# Patient Record
Sex: Female | Born: 1935 | Race: White | Hispanic: No | Marital: Married | Smoking: Never smoker
Health system: Southern US, Community
[De-identification: ages and names within clinical notes are randomized; demographics above are authoritative.]

## PROBLEM LIST (undated history)

## (undated) DIAGNOSIS — E079 Disorder of thyroid, unspecified: Secondary | ICD-10-CM

## (undated) DIAGNOSIS — F0390 Unspecified dementia without behavioral disturbance: Secondary | ICD-10-CM

## (undated) HISTORY — PX: TONSILLECTOMY: SUR1361

## (undated) HISTORY — PX: APPENDECTOMY: SHX54

## (undated) HISTORY — PX: BACK SURGERY: SHX140

---

## 2002-05-28 ENCOUNTER — Encounter: Admission: RE | Admit: 2002-05-28 | Discharge: 2002-05-28 | Payer: Self-pay | Admitting: *Deleted

## 2002-05-28 ENCOUNTER — Encounter: Payer: Self-pay | Admitting: *Deleted

## 2003-01-21 ENCOUNTER — Other Ambulatory Visit: Admission: RE | Admit: 2003-01-21 | Discharge: 2003-01-21 | Payer: Self-pay | Admitting: Obstetrics & Gynecology

## 2004-08-17 ENCOUNTER — Encounter: Admission: RE | Admit: 2004-08-17 | Discharge: 2004-08-17 | Payer: Self-pay | Admitting: Gastroenterology

## 2004-09-13 ENCOUNTER — Ambulatory Visit (HOSPITAL_COMMUNITY): Admission: RE | Admit: 2004-09-13 | Discharge: 2004-09-13 | Payer: Self-pay | Admitting: Gastroenterology

## 2005-02-15 ENCOUNTER — Encounter (INDEPENDENT_AMBULATORY_CARE_PROVIDER_SITE_OTHER): Payer: Self-pay | Admitting: *Deleted

## 2005-02-15 ENCOUNTER — Ambulatory Visit (HOSPITAL_COMMUNITY): Admission: RE | Admit: 2005-02-15 | Discharge: 2005-02-16 | Payer: Self-pay | Admitting: General Surgery

## 2005-03-12 ENCOUNTER — Ambulatory Visit (HOSPITAL_COMMUNITY): Admission: RE | Admit: 2005-03-12 | Discharge: 2005-03-12 | Payer: Self-pay | Admitting: General Surgery

## 2005-06-24 ENCOUNTER — Emergency Department (HOSPITAL_COMMUNITY): Admission: EM | Admit: 2005-06-24 | Discharge: 2005-06-24 | Payer: Self-pay | Admitting: Emergency Medicine

## 2005-07-25 ENCOUNTER — Encounter: Admission: RE | Admit: 2005-07-25 | Discharge: 2005-09-06 | Payer: Self-pay | Admitting: Family Medicine

## 2005-12-31 ENCOUNTER — Encounter: Payer: Self-pay | Admitting: Interventional Radiology

## 2006-01-04 ENCOUNTER — Ambulatory Visit (HOSPITAL_COMMUNITY): Admission: RE | Admit: 2006-01-04 | Discharge: 2006-01-04 | Payer: Self-pay | Admitting: Interventional Radiology

## 2006-01-04 ENCOUNTER — Encounter (INDEPENDENT_AMBULATORY_CARE_PROVIDER_SITE_OTHER): Payer: Self-pay | Admitting: *Deleted

## 2006-01-23 ENCOUNTER — Encounter: Payer: Self-pay | Admitting: Interventional Radiology

## 2006-08-28 ENCOUNTER — Encounter: Payer: Self-pay | Admitting: Interventional Radiology

## 2010-01-31 ENCOUNTER — Encounter: Payer: Self-pay | Admitting: Interventional Radiology

## 2011-01-20 ENCOUNTER — Emergency Department (HOSPITAL_COMMUNITY): Payer: Medicare Other

## 2011-01-20 ENCOUNTER — Emergency Department (HOSPITAL_COMMUNITY)
Admission: EM | Admit: 2011-01-20 | Discharge: 2011-01-20 | Disposition: A | Discharge status: Home / Self Care | Payer: Medicare Other | Attending: Emergency Medicine | Admitting: Emergency Medicine

## 2011-01-20 DIAGNOSIS — Y929 Unspecified place or not applicable: Secondary | ICD-10-CM | POA: Insufficient documentation

## 2011-01-20 DIAGNOSIS — E039 Hypothyroidism, unspecified: Secondary | ICD-10-CM | POA: Insufficient documentation

## 2011-01-20 DIAGNOSIS — W010XXA Fall on same level from slipping, tripping and stumbling without subsequent striking against object, initial encounter: Secondary | ICD-10-CM | POA: Insufficient documentation

## 2011-01-20 DIAGNOSIS — M542 Cervicalgia: Secondary | ICD-10-CM | POA: Insufficient documentation

## 2011-01-20 DIAGNOSIS — S52599A Other fractures of lower end of unspecified radius, initial encounter for closed fracture: Secondary | ICD-10-CM | POA: Insufficient documentation

## 2011-01-20 DIAGNOSIS — R51 Headache: Secondary | ICD-10-CM | POA: Insufficient documentation

## 2011-01-20 DIAGNOSIS — S0180XA Unspecified open wound of other part of head, initial encounter: Secondary | ICD-10-CM | POA: Insufficient documentation

## 2011-01-23 ENCOUNTER — Observation Stay (HOSPITAL_COMMUNITY)
Admission: RE | Admit: 2011-01-23 | Discharge: 2011-01-24 | Disposition: A | Discharge status: Home / Self Care | Payer: Medicare Other | Source: Ambulatory Visit | Attending: Orthopaedic Surgery | Admitting: Orthopaedic Surgery

## 2011-01-23 DIAGNOSIS — S52599A Other fractures of lower end of unspecified radius, initial encounter for closed fracture: Principal | ICD-10-CM | POA: Insufficient documentation

## 2011-01-23 DIAGNOSIS — E039 Hypothyroidism, unspecified: Secondary | ICD-10-CM | POA: Insufficient documentation

## 2011-01-23 DIAGNOSIS — Z01812 Encounter for preprocedural laboratory examination: Secondary | ICD-10-CM | POA: Insufficient documentation

## 2011-01-23 DIAGNOSIS — M129 Arthropathy, unspecified: Secondary | ICD-10-CM | POA: Insufficient documentation

## 2011-01-23 DIAGNOSIS — W19XXXA Unspecified fall, initial encounter: Secondary | ICD-10-CM | POA: Insufficient documentation

## 2011-01-23 LAB — SURGICAL PCR SCREEN
MRSA, PCR: NEGATIVE
Staphylococcus aureus: NEGATIVE

## 2011-01-23 LAB — CBC
Hemoglobin: 12.5 g/dL (ref 12.0–15.0)
Platelets: 157 10*3/uL (ref 150–400)
RBC: 4.1 MIL/uL (ref 3.87–5.11)
WBC: 5 10*3/uL (ref 4.0–10.5)

## 2011-01-23 LAB — PROTIME-INR
INR: 1.05 (ref 0.00–1.49)
Prothrombin Time: 13.9 seconds (ref 11.6–15.2)

## 2011-01-27 NOTE — Op Note (Signed)
[name], FRESE              ACCOUNT NO.:  [id number]  MEDICAL RECORD NO.:  [id number]           PATIENT TYPE:  I  LOCATION:  5031                         FACILITY:  [facility]  PHYSICIAN:  Vanita Panda. Magnus Ivan, M.D.DATE OF BIRTH:  Jan 13, 1923  DATE OF PROCEDURE:  01/23/2011 DATE OF DISCHARGE:                              OPERATIVE REPORT   PREOPERATIVE DIAGNOSIS:  Left unstable displaced extraarticular distal radius fracture.  POSTOPERATIVE DIAGNOSIS:  Left unstable displaced extraarticular distal radius fracture.  PROCEDURE:  Open reduction and internal fixation of left distal radius fracture using a hand innovations distal volar locking plate.  SURGEON:  Vanita Panda. Magnus Ivan, MD  ANESTHESIA: 1. Left upper extremity regional anesthesia with Bier block. 2. Local infiltration with 0.25% plain Sensorcaine.  ANTIBIOTICS:  600 mg IV clindamycin.  TECHNIQUE:  Less than 2 hours.  BLOOD LOSS:  Minimal.  COMPLICATIONS:  None.  INDICATIONS:  [name] is an very pleasant elderly 77 year old who is right-hand dominant.  She sustained mechanical fall this week and had a severely displaced extraarticular distal radius fracture due to the frail nature of her skin and displaced nature of this fracture, and due to the fact that she is active individual, I recommend she undergo open reduction and internal fixation with plating of this fracture.  The risks and benefits of this were explained to her in detail, and she did wish to proceed with surgery.  PROCEDURE DESCRIPTION:  After informed consent was obtained, the appropriate left wrist was marked.  She was brought to the operating room and placed supine on the operating table with her left arm on the radiolucent arm table.  A Bier block on the upper extremity was then obtained.  Her left hand and wrist were prepped and draped with DuraPrep and sterile drapes.  A time-out called  to identify the correct patient and correct  left wrist.  I then tested with wrist forceps and she had adequate anesthesia.  I was able to make incision with volar aspect of wrist and dissect down interval between the radial artery and flexor carpi radialis tendon.  I was able to expose pronator quadratus and divide this to identify the fracture.  This was a shear type of fracture, there was volarly based.  I was able to pull the fracture into reduced the position and verified this under fluoroscopic guidance.  I then choose a narrow hand innovations with distal volar locking plate and placed this along the volar cortex.  I secured this proximally and distally with bicortical screws proximally and locking plate distally to secure the fracture.  I then put the wrist with good range of motion and was stable.  I copiously irrigated the tissues, and then I closed the subcutaneous tissue with interrupted 2-0 Vicryl  followed by interrupted 3-0 nylon   on the skin.  The incision was infiltrated with 0.25% plain Sensorcaine.  Xeroform followed by well-padded volar plaster splint were applied.  The tourniquet was let down, and the fingers pinkened and nicely.  The patient was awakened and was taken to recovery room in stable condition.  Postoperatively, we will admit her for  pain control and IV antibiotics with discharge tomorrow and hopefully remove wrist splint.     Vanita Panda. Magnus Ivan, M.D.     CYB/MEDQ  D:  01/23/2011  T:  01/24/2011  Job:  [id number]  Electronically Signed by Doneen Poisson M.D. on 01/27/2011 07:17:37 PM

## 2011-01-27 NOTE — Discharge Summary (Signed)
  [name], [name]              ACCOUNT NO.:  [id number]  MEDICAL RECORD NO.:  [id number]           PATIENT TYPE:  I  LOCATION:  5031                         FACILITY:  [facility]  PHYSICIAN:  Vanita Panda. Magnus Ivan, M.D.DATE OF BIRTH:  28-Oct-1923  DATE OF ADMISSION:  01/23/2011 DATE OF DISCHARGE:  01/24/2011                              DISCHARGE SUMMARY   ADMITTING DIAGNOSIS:  Left distal radius fracture.  DISCHARGE DIAGNOSIS:  Left distal radius fracture.  PROCEDURE:  Open reduction and internal fixation of left distal radius fracture performed on January 23, 2011.  HOSPITAL COURSE:  [name] is a pleasant 77 year old female who had sustained a mechanical fall on Sunday of this week.  She was seen in our office on Monday and was found to have a severely placed distal radius fracture that was extraarticular.  It was recommended she undergo open reduction and internal fixation of this injury.  She was taken to the operating room on the day of admission where she underwent successful plating of her wrist.  She was then admitted for observation.  By postop day 1, she was doing well.  Her incision was clean and dry and intact, and it was felt that she could be discharged today to home.  DISPOSITION:  To home.  DISCHARGE INSTRUCTIONS:  While she is at home, she will wait 5 days to get her incision wet in shower.  She will use a Velcro wrist splint which she can come out after working on range of motion of her wrist. Followup appointment will be established with Dr. Magnus Ivan in 2 weeks.  DISCHARGE MEDICATIONS:  Percocet as needed for pain.     Vanita Panda. Magnus Ivan, M.D.     CYB/MEDQ  D:  01/24/2011  T:  01/24/2011  Job:  [id number]  Electronically Signed by Doneen Poisson M.D. on 01/27/2011 07:17:38 PM

## 2011-01-27 NOTE — H&P (Signed)
  [name], [name]              ACCOUNT NO.:  [id number]  MEDICAL RECORD NO.:  [id number]           PATIENT TYPE:  I  LOCATION:  5031                         FACILITY:  [facility]  PHYSICIAN:  Vanita Panda. Magnus Ivan, M.D.DATE OF BIRTH:  Apr 05, 1923  DATE OF ADMISSION:  01/23/2011 DATE OF DISCHARGE:                             HISTORY & PHYSICAL   CHIEF COMPLAINT:  Left wrist pain with known unstable distal radius fracture.  HISTORY OF PRESENT ILLNESS:  [name] is an very active 77 year old female who had mechanical fall this week and she was seen in the emergency room at [facility] and found to have unstable left distal radius fracture.  She was placed appropriately in a splint and given follow up in our office yesterday.  I showed the x-rays to her husband and they felt this was a significantly shortened and displaced distal radius fracture.  She is an very active 77 year old and due to the displaced nature of the fracture, I recommend she undergo plating that will allow for [place] to avoid any type of casting on her fragile skin.  She did understand the risk and benefits of this and does wish to proceed with surgery, especially given the fact that she is going to be going to [place] in a month and we can have her out of a cast even in just a week or 2.  The risks and benefits of surgery were explained to her in detail and her husband did wish to proceed with surgery.  PAST MEDICAL HISTORY: 1. Osteoporosis. 2. Thyroid disease. 3. Acid reflux. 4. Cataracts.  MEDICATIONS: 1. Levothyroxine. 2. Aspirin 81 mg. 3. Multivitamin. 4. Vitamin D. 5. Calcium.  ALLERGIES:  PENICILLIN allergy.  FAMILY MEDICAL HISTORY:  Bladder and colon cancer.  SOCIAL HISTORY:  She is married.  Her husband is with her.  She does not smoke, does not drink.  REVIEW OF SYSTEMS:  Negative for chest pain, shortness of breath, fever, chills, nausea, and vomiting.  PHYSICAL EXAMINATION:  VITAL SIGNS:   She is afebrile with stable vital signs. GENERAL:  She is alert and oriented x3, in no acute distress, in minimal discomfort. HEENT:  Normocephalic and atraumatic.  Pupils are equal, round, and reactive to light.  There is bruising on her face from her fall and around her eye, but her visions intact. NECK:  Supple.  No JVD.  No bruits. LUNGS:  Clear to auscultation bilaterally. HEART:  Regular rate and rhythm. ABDOMEN:  Benign. EXTREMITIES:  Left wrist shows severe bruising and deformity of left wrist.  She is neurovascularly intact.  X-rays reviewed and with completely displaced and shortened left distal radius fracture.  It does appear to be extraarticular.  ASSESSMENT:  Unstable left distal radius fracture.  PLAN:  We will proceed to the operating room today for open reduction and internal fixation volar plate, we then admit her for observation.     Vanita Panda. Magnus Ivan, M.D.     CYB/MEDQ  D:  01/23/2011  T:  01/24/2011  Job:  [id number]  Electronically Signed by Doneen Poisson M.D. on 01/27/2011 07:17:31 PM

## 2011-02-07 ENCOUNTER — Other Ambulatory Visit: Payer: Self-pay | Admitting: Family Medicine

## 2011-02-07 DIAGNOSIS — M549 Dorsalgia, unspecified: Secondary | ICD-10-CM

## 2011-02-07 DIAGNOSIS — M546 Pain in thoracic spine: Secondary | ICD-10-CM

## 2011-02-09 ENCOUNTER — Ambulatory Visit
Admission: RE | Admit: 2011-02-09 | Discharge: 2011-02-09 | Disposition: A | Discharge status: Home / Self Care | Payer: Medicare Other | Source: Ambulatory Visit | Attending: Family Medicine | Admitting: Family Medicine

## 2011-02-09 DIAGNOSIS — M549 Dorsalgia, unspecified: Secondary | ICD-10-CM

## 2011-02-09 DIAGNOSIS — M546 Pain in thoracic spine: Secondary | ICD-10-CM

## 2011-02-12 ENCOUNTER — Other Ambulatory Visit (HOSPITAL_COMMUNITY): Payer: Self-pay | Admitting: Interventional Radiology

## 2011-02-12 DIAGNOSIS — IMO0002 Reserved for concepts with insufficient information to code with codable children: Secondary | ICD-10-CM

## 2011-02-14 ENCOUNTER — Ambulatory Visit (HOSPITAL_COMMUNITY)
Admission: RE | Admit: 2011-02-14 | Discharge: 2011-02-14 | Disposition: A | Discharge status: Home / Self Care | Payer: Medicare Other | Source: Ambulatory Visit | Attending: Interventional Radiology | Admitting: Interventional Radiology

## 2011-02-14 DIAGNOSIS — K219 Gastro-esophageal reflux disease without esophagitis: Secondary | ICD-10-CM | POA: Insufficient documentation

## 2011-02-14 DIAGNOSIS — IMO0002 Reserved for concepts with insufficient information to code with codable children: Secondary | ICD-10-CM

## 2011-02-14 DIAGNOSIS — E039 Hypothyroidism, unspecified: Secondary | ICD-10-CM | POA: Insufficient documentation

## 2011-02-14 DIAGNOSIS — Z01812 Encounter for preprocedural laboratory examination: Secondary | ICD-10-CM | POA: Insufficient documentation

## 2011-02-14 DIAGNOSIS — M810 Age-related osteoporosis without current pathological fracture: Secondary | ICD-10-CM | POA: Insufficient documentation

## 2011-02-14 DIAGNOSIS — M8448XA Pathological fracture, other site, initial encounter for fracture: Secondary | ICD-10-CM | POA: Insufficient documentation

## 2011-02-14 DIAGNOSIS — Z79899 Other long term (current) drug therapy: Secondary | ICD-10-CM | POA: Insufficient documentation

## 2011-02-14 LAB — PROTIME-INR: Prothrombin Time: 14.4 seconds (ref 11.6–15.2)

## 2011-02-14 LAB — CBC
MCV: 90.5 fL (ref 78.0–100.0)
Platelets: 276 10*3/uL (ref 150–400)
RDW: 13.6 % (ref 11.5–15.5)
WBC: 7.1 10*3/uL (ref 4.0–10.5)

## 2011-02-14 LAB — POCT I-STAT, CHEM 8
Creatinine, Ser: 1.2 mg/dL (ref 0.4–1.2)
Hemoglobin: 14.6 g/dL (ref 12.0–15.0)
Sodium: 137 mEq/L (ref 135–145)
TCO2: 25 mmol/L (ref 0–100)

## 2011-02-14 LAB — DIFFERENTIAL
Basophils Absolute: 0 10*3/uL (ref 0.0–0.1)
Basophils Relative: 0 % (ref 0–1)
Eosinophils Absolute: 0.1 10*3/uL (ref 0.0–0.7)
Eosinophils Relative: 1 % (ref 0–5)
Lymphs Abs: 0.6 10*3/uL — ABNORMAL LOW (ref 0.7–4.0)

## 2011-02-14 MED ORDER — IOHEXOL 300 MG/ML  SOLN: 50.0000 mL | Freq: Once | INTRAMUSCULAR | Status: DC | PRN

## 2011-02-14 MED ORDER — IOHEXOL 300 MG/ML  SOLN
50.0000 mL | Freq: Once | INTRAMUSCULAR | Status: AC | PRN
  Administered 2011-02-14: 3 mL via INTRAVENOUS

## 2011-02-19 ENCOUNTER — Other Ambulatory Visit (HOSPITAL_COMMUNITY): Payer: Self-pay | Admitting: Interventional Radiology

## 2011-02-19 DIAGNOSIS — M549 Dorsalgia, unspecified: Secondary | ICD-10-CM

## 2011-02-21 ENCOUNTER — Ambulatory Visit (HOSPITAL_COMMUNITY)
Admission: RE | Admit: 2011-02-21 | Discharge: 2011-02-21 | Disposition: A | Discharge status: Home / Self Care | Payer: Medicare Other | Source: Ambulatory Visit | Attending: Interventional Radiology | Admitting: Interventional Radiology

## 2011-02-21 ENCOUNTER — Other Ambulatory Visit (HOSPITAL_COMMUNITY): Payer: Self-pay | Admitting: Interventional Radiology

## 2011-02-21 DIAGNOSIS — S22009A Unspecified fracture of unspecified thoracic vertebra, initial encounter for closed fracture: Secondary | ICD-10-CM | POA: Insufficient documentation

## 2011-02-21 DIAGNOSIS — X58XXXA Exposure to other specified factors, initial encounter: Secondary | ICD-10-CM | POA: Insufficient documentation

## 2011-02-21 DIAGNOSIS — M47817 Spondylosis without myelopathy or radiculopathy, lumbosacral region: Secondary | ICD-10-CM | POA: Insufficient documentation

## 2011-02-21 DIAGNOSIS — M549 Dorsalgia, unspecified: Secondary | ICD-10-CM

## 2011-02-21 DIAGNOSIS — IMO0002 Reserved for concepts with insufficient information to code with codable children: Secondary | ICD-10-CM

## 2011-02-23 ENCOUNTER — Other Ambulatory Visit: Payer: Self-pay | Admitting: Interventional Radiology

## 2011-02-23 ENCOUNTER — Ambulatory Visit (HOSPITAL_COMMUNITY)
Admission: RE | Admit: 2011-02-23 | Discharge: 2011-02-23 | Disposition: A | Discharge status: Home / Self Care | Payer: Medicare Other | Source: Ambulatory Visit | Attending: Interventional Radiology | Admitting: Interventional Radiology

## 2011-02-23 ENCOUNTER — Other Ambulatory Visit (HOSPITAL_COMMUNITY): Payer: Self-pay | Admitting: Interventional Radiology

## 2011-02-23 DIAGNOSIS — M8448XA Pathological fracture, other site, initial encounter for fracture: Secondary | ICD-10-CM | POA: Insufficient documentation

## 2011-02-23 DIAGNOSIS — K219 Gastro-esophageal reflux disease without esophagitis: Secondary | ICD-10-CM | POA: Insufficient documentation

## 2011-02-23 DIAGNOSIS — IMO0002 Reserved for concepts with insufficient information to code with codable children: Secondary | ICD-10-CM

## 2011-02-23 DIAGNOSIS — Z01812 Encounter for preprocedural laboratory examination: Secondary | ICD-10-CM | POA: Insufficient documentation

## 2011-02-23 DIAGNOSIS — M810 Age-related osteoporosis without current pathological fracture: Secondary | ICD-10-CM | POA: Insufficient documentation

## 2011-02-23 LAB — POCT I-STAT, CHEM 8
Creatinine, Ser: 0.9 mg/dL (ref 0.4–1.2)
Glucose, Bld: 114 mg/dL — ABNORMAL HIGH (ref 70–99)
Hemoglobin: 14.6 g/dL (ref 12.0–15.0)
Sodium: 142 mEq/L (ref 135–145)
TCO2: 28 mmol/L (ref 0–100)

## 2011-02-23 LAB — PROTIME-INR: Prothrombin Time: 13.4 seconds (ref 11.6–15.2)

## 2011-02-23 LAB — CBC
MCH: 29.4 pg (ref 26.0–34.0)
MCHC: 32.7 g/dL (ref 30.0–36.0)
Platelets: 235 10*3/uL (ref 150–400)
RBC: 4.69 MIL/uL (ref 3.87–5.11)

## 2011-02-23 MED ORDER — IOHEXOL 300 MG/ML  SOLN
50.0000 mL | Freq: Once | INTRAMUSCULAR | Status: AC | PRN
  Administered 2011-02-23: 10 mL

## 2011-02-27 ENCOUNTER — Other Ambulatory Visit (HOSPITAL_COMMUNITY): Payer: Self-pay | Admitting: Interventional Radiology

## 2011-02-27 DIAGNOSIS — IMO0002 Reserved for concepts with insufficient information to code with codable children: Secondary | ICD-10-CM

## 2011-03-09 ENCOUNTER — Ambulatory Visit (HOSPITAL_COMMUNITY)
Admission: RE | Admit: 2011-03-09 | Discharge: 2011-03-09 | Disposition: A | Discharge status: Home / Self Care | Payer: Medicare Other | Source: Ambulatory Visit | Attending: Interventional Radiology | Admitting: Interventional Radiology

## 2011-03-09 DIAGNOSIS — IMO0002 Reserved for concepts with insufficient information to code with codable children: Secondary | ICD-10-CM

## 2011-03-30 ENCOUNTER — Other Ambulatory Visit: Payer: Self-pay | Admitting: General Surgery

## 2011-03-30 ENCOUNTER — Ambulatory Visit
Admission: RE | Admit: 2011-03-30 | Discharge: 2011-03-30 | Disposition: A | Discharge status: Home / Self Care | Payer: Medicare Other | Source: Ambulatory Visit | Attending: General Surgery | Admitting: General Surgery

## 2011-03-30 DIAGNOSIS — R52 Pain, unspecified: Secondary | ICD-10-CM

## 2011-04-06 NOTE — Op Note (Signed)
[name], [name]              ACCOUNT NO.:  [id number]   MEDICAL RECORD NO.:  [id number]          PATIENT TYPE:  OIB   LOCATION:  2887                         FACILITY:  [facility]   PHYSICIAN:  Adolph Pollack, M.D.DATE OF BIRTH:  [birth year]   DATE OF PROCEDURE:  02/15/2005  DATE OF DISCHARGE:                                 OPERATIVE REPORT   PREOPERATIVE DIAGNOSIS:  Symptomatic cholelithiasis.   POSTOPERATIVE DIAGNOSIS:  Chronic calculus cholecystitis.   PROCEDURE:  Laparoscopic cholecystectomy with intraoperative cholangiogram.   SURGEON:  Adolph Pollack, M.D.   ASSISTANT:  Magnus Ivan, RNFA.   ANESTHESIA:  General.   INDICATIONS:  [name] is a 77 year old female who has had intermittent  episodes of pressure and cramping-type right upper quadrant pain, radiating  around to her back with little nausea.  Evaluation demonstrated multiple  gallstones on ultrasound.  The gallbladder was thickened.  The bile duct  appeared within normal limits.  She now presents for elective laparoscopic  cholecystectomy.  I have discussed the procedure and the risks with her  previously.   TECHNIQUE OF PROCEDURE:  She was seen in the holding area and then brought  to the operating room and placed supine on the operating table and a general  anesthetic was administered.  The abdominal wall was sterilely prepped and  draped.  Marcaine solution was infiltrated in the subumbilical region.  A  small subumbilical incision was made through the skin, subcutaneous tissue,  fascia, peritoneum and the peritoneal cavity was entered under direct  vision.  A purse-string suture of 0 Vicryl was placed around the fascial  edges.  A Hasson trocar was introduced into the peritoneal cavity and  pneumoperitoneum was created by insufflation of CO2 gas.   Next, the laparoscope was introduced.  An 11-mm trocar was placed in the  epigastric incision and two 5-mm trocars were placed through right  middle  abdominal wall incisions.  She was placed in the reversed Trendelenburg  position with the right side tilted slightly up.  The fundus of the  gallbladder was grasped and retracted towards the right shoulder.  The  gallbladder appeared to have some grossly chronic inflammatory-type changes.  The infundibulum was grasped and using blunt dissection and select cautery  the infundibulum was mobilized.  The cystic duct was isolated and a window  was created around it.  The cystic artery was isolated and a window created  around it as well.  The cystic duct was clipped just above the cystic  duct/gallbladder junction and a small incision made at the cystic  duct/gallbladder junction.  Some bile was evacuated.  A cholangiocatheter  was passed through the anterior abdominal wall and placed into the cystic  duct and cholangiogram performed.   Under real-time fluoroscopy, dilute contrast material was injected into the  cystic duct which was of moderate length.  The common hepatic, right and  left hepatic and common bile duct all filled promptly and contrast went into  the duodenum promptly.  No obvious evidence of obstruction existed.  Final  report is pending radiologist's interpretation.   The cholangiocatheter  was then removed, the cystic duct was clipped three  times proximally and divided sharply.  The cystic artery was clipped and  divided.  The gallbladder was dissected free from the liver bed intact using  electrocautery.  The gallbladder fossa was irrigated and inspected.  No  bleeding or bile leak was noted.  The gallbladder was placed in an Endopouch  bag and removed through the subumbilical port.   The subumbilical fascial defect was closed by tying down the purse-string  suture under laparoscopic vision.  An adhesion between the abdominal wall  and the omentum was then lysed sharply in this area.  The perihepatic area  was irrigated and the fluid evacuated.  The remaining  trocars were removed  and pneumoperitoneum released.   The skin incisions were closed with 4-0 Monocryl subcuticular stitches.  Steri-Strips and sterile dressings were applied.  She tolerated the  procedure well without any apparent complications and was taken to the  recovery room in satisfactory condition.      TJR/MEDQ  D:  02/15/2005  T:  02/15/2005  Job:  [id number]   cc:   Caryn Bee L. Little, M.D.  [address]  [place]  [place] [address]  Fax: [phone]   Anselmo Rod, M.D.  [address]  [address]  [place]  [place] [address]  Fax: [phone]

## 2011-04-06 NOTE — Consult Note (Signed)
[name], [name]              ACCOUNT NO.:  [id number]   MEDICAL RECORD NO.:  [id number]          PATIENT TYPE:  OUT   LOCATION:  XRAY                         FACILITY:  [facility]   PHYSICIAN:  Sanjeev K. Deveshwar, M.D.DATE OF BIRTH:  Jul 21, 1923   DATE OF CONSULTATION:  12/31/2005  DATE OF DISCHARGE:                                   CONSULTATION   ADDENDUM:  Greater than 40 minutes was spent on this consult today.      Delton See, P.A.    ______________________________  Grandville Silos. Corliss Skains, M.D.    DR/MEDQ  D:  12/31/2005  T:  12/31/2005  Job:  [id number]

## 2011-04-06 NOTE — Consult Note (Signed)
NAMECARRELL, PALMATIER              ACCOUNT NO.:  [id number]   MEDICAL RECORD NO.:  [id number]          PATIENT TYPE:  OUT   LOCATION:  XRAY                         FACILITY:  [facility]   PHYSICIAN:  Delton See, P.A.   DATE OF BIRTH:  [birth year]   DATE OF CONSULTATION:  08/28/2006  DATE OF DISCHARGE:                                   CONSULTATION   DATE OF CONSULTATION:  August 28, 2006.   REFERRING PHYSICIAN:  Dr. Catha Gosselin.   CHIEF COMPLAINT:  Back pain.   HISTORY OF PRESENT ILLNESS:  This is a pleasant 77 year old female who was  previously referred to Dr. Corliss Skains through the courtesy of Dr. Catha Gosselin for evaluation of back pain in March 2007.  The patient was found to  have two compression fractures, one at T6 and one at T7, by an MRI performed  at [facility].  On January 04, 2006, the patient underwent a kyphoplasty  procedure at the T6 level.  The T7 level was attempted, however, it was too  severely compressed to be treated.  The patient did well following these  procedures.  However, recently over the past several weeks she has had  recurrent back pain and she returns today for further evaluation.   PAST MEDICAL HISTORY:  Significant for gastroesophageal reflux disease,  hyperlipidemia, a history of palpitations, hypothyroidism, and osteoporosis.   SURGICAL HISTORY:  Significant for shoulder surgery, appendectomy, and  cholecystectomy.   ALLERGIES:  She is allergic to PENICILLIN.   CURRENT MEDICATIONS:  The patient is taking Advil on a p.r.n. basis for  pain.  She is not on any antihypertensive medications.  We do not have a  list of her medications at this time   SOCIAL HISTORY:  The patient is married.  She has two children.  She and her  husband live in [place].  They have a daughter in [place], [place].  She has never been a smoker.  She drinks alcohol occasionally.  She is a  retired [employer] operational officer.   FAMILY HISTORY:  Her mother died at age 34  from cancer.  Her father died at  age 12 when he was struck by a motor vehicle.   IMPRESSION AND PLAN:  As noted, the patient returns today to be evaluated  for further back pain.  She describes a pain which is ligated pinprick  sensation on the right just under her scapula.  It radiates to the left and  becomes a dull ache.  The pain was bad several weeks ago, however, it  appears to be improving.  It is worse with movement and it does become more  severe as the day progresses.   The patient also reports that this morning prior to her office visit she had  a dizzy spell when she got up out of bed.  A short while later she was  ambulating and she suddenly became acutely dizzy and fell straight back,  hitting her head on the carpet and striking her back on a small table I  believe.  She did not notice any heart racing prior to  this event.  She  denies loss of consciousness.  As noted, she is not on any blood pressure  medications.  She states the episode occurred very suddenly.   Dr. Corliss Skains discussed the situation with the patient and her husband.  He  recommended an MRI of the thoracic spine to rule out a new compression  fracture as possible source of her back pain.  He also recommended an MRA of  the neck to rule out significant vascular disease as a result of her fall  today.  Apparently she has had a similar fall in the past.  Other concerns  would be that this was a severe case of vertigo or that she had some type of  heart arrhythmia causing her to have a brief syncopal episode, although she  did deny loss of consciousness and she did not feel her heart racing prior  to this event.  These studies will be scheduled on an outpatient basis.  We  will contact the patient once we have the results.  She also plans to  followup with a primary care physician, Dr. Clarene Duke, to discuss this episode.   Greater than 30 minutes was spent on this consult.      Delton See, P.A.      DR/MEDQ  D:  08/28/2006  T:  08/29/2006  Job:  [id number]   cc:   Caryn Bee L. Little, M.D.

## 2011-04-06 NOTE — Consult Note (Signed)
[name], [name]              ACCOUNT NO.:  [id number]   MEDICAL RECORD NO.:  [id number]          PATIENT TYPE:  OUT   LOCATION:  XRAY                         FACILITY:  [facility]   PHYSICIAN:  [name], M.D.DATE OF BIRTH:  [birth year]   DATE OF CONSULTATION:  01/23/2006  DATE OF DISCHARGE:                                   CONSULTATION   BRIEF HISTORY:  This is a very pleasant 77 year old female with a history of  osteoporosis. She was seen in consultation by Dr. Corliss [name] on December 31, 2005, following referral from Dr. Caryn Bee [name]'s office. The patient had  previously seen Dr. Sharolyn [name].  She was found to have compression fractures  at T6 as well as T7.  She had an MRI at [facility].  Dr. Corliss [name]  reviewed the results of the MRI with the patient and her husband. It was  felt that she would be a candidate for a kyphoplasty at the T6 level,  however, the T7 level was severely compressed.  He would attempted to a  vertebroplasty at this level. However, unfortunately the intervention could  not be performed due to the severity of the fracture. She did undergo a  kyphoplasty at the T6 level on January 04, 2006.  The patient and her  husband now return to be seen in follow-up approximately two weeks after the  procedure.   PAST MEDICAL HISTORY:  1.  Gastroesophageal reflux disease.  2.  Hyperlipidemia.  3.  Previous history of palpitations, felt to be benign.  4.  Hypothyroidism.  5.  Osteoporosis, being treated with Fosamax.  6.  History of shoulder surgery in August of 2006.   PAST SURGICAL HISTORY:  1.  Appendectomy.  2.  Cholecystectomy.   ALLERGIES:  The patient allergic to PENICILLIN.   SOCIAL HISTORY:  The patient is married. She has two children. She and her  husband live in [place], [place]. They have a daughter in  [place], [place].  She has never been a smoker. She drinks alcohol  occasionally. She is a retired [employer] operational officer.   FAMILY  HISTORY:  Mother died at age 23 from cancer. Her father died at age  67 when he was hit by a motor vehicle.   IMPRESSION AND PLAN:  As noted, the patient underwent a T6 kyphoplasty on  January 04, 2006.  A vertebroplasty was planned for T7, however, due to the  severity of the fracture, this could not be performed. The biopsy results  from the T6 kyphoplasty were negative for any malignancy.   The patient reports that she is doing better. She rates her pain at being  approximately 80% better, although she still has some discomfort which she  describes as a dull ache, but an occasional sharp pain as well. Some days  her better than others, but overall she appears to be improving. The patient  and her husband had many questions, all of which were answered. They also  asked about other treatments for osteoporosis other than Fosamax and they  were given some information on the medication Forteo.  They were asked  to  review this information and discuss it with her primary care physician to  [name] if she would be a candidate for this therapy.   Dr. Corliss [name] gave the patient and her husband instructions were regarding  activity limitations and recommendations to prevent further fractures. There  were told to contact us if she developed further severe back pain.   Greater than 30 minutes was spent on this consult.      [name], P.A.    ______________________________  [name]. [name], M.D.    DR/MEDQ  D:  01/23/2006  T:  01/24/2006  Job:  [id number]   cc:   Jaclyn Bee L. [name], M.D.  Fax: [phone]

## 2011-04-06 NOTE — Consult Note (Signed)
NAMEBOBBY, RAGAN              ACCOUNT NO.:  [id number]   MEDICAL RECORD NO.:  [id number]          PATIENT TYPE:  OUT   LOCATION:  XRAY                         FACILITY:  [facility]   PHYSICIAN:  Delton See, P.A.   DATE OF BIRTH:  1922/11/21   DATE OF CONSULTATION:  DATE OF DISCHARGE:                                   CONSULTATION   Audio too short to transcribe (less than 5 seconds)      Delton See, P.A.     DR/MEDQ  D:  08/28/2006  T:  08/28/2006  Job:  [id number]

## 2011-04-06 NOTE — Op Note (Signed)
[name], [name]              ACCOUNT NO.:  [id number]   MEDICAL RECORD NO.:  [id number]          PATIENT TYPE:  AMB   LOCATION:  ENDO                         FACILITY:  [facility]   PHYSICIAN:  Anselmo Rod, M.D.  DATE OF BIRTH:  1923-03-29   DATE OF PROCEDURE:  09/13/2004  DATE OF DISCHARGE:                                 OPERATIVE REPORT   PROCEDURE PERFORMED:  Screen colonoscopy.   ENDOSCOPIST:  Anselmo Rod, M.D.   INSTRUMENT USED:  Olympus video colonoscope.   INDICATIONS FOR PROCEDURE:  A 77 year old white female who undergoing a  screening colonoscopy to rule out colonic polyps, masses, etc.   PREPROCEDURE PREPARATION:  Informed consent was procured from the patient.  The patient fasted for eight hours prior to the procedure and prepped with a  bottle of magnesium citrate and a gallop of GoLYTELY the night prior to the  procedure.   PREPROCEDURE PHYSICAL:  VITAL SIGNS:  Stable vital signs.  NECK:  Supple.  CHEST:  Clear to auscultation.  CARDIOVASCULAR:  S1 and S2 regular.  ABDOMEN:  Soft with normal bowel sounds.   DESCRIPTION OF PROCEDURE:  The patient was placed in left lateral decubitus  position, sedated with 50 mg of Demerol and 5 mg of Versed in slow  incremental doses.  Once the patient was adequately sedated and maintained  on low flow oxygen and continuous cardiac monitoring, the Olympus video  colonoscope was advanced from the rectum to the cecum.  The appendiceal  orifice and ileocecal valve were clearly visualized and photographed.  No  masses, polyps, erosions, ulcerations or diverticula were identified.  Retroflexion in the rectum revealed no abnormalities.  The patient had a  somewhat tortuous colon and gentle abdominal pressure was applied to reach  the cecum.  The patient tolerated the procedure well without any immediate  complications.   IMPRESSION:  Essentially unrevealing colonoscopy of the cecum.   RECOMMENDATIONS:  1.  Repeat  colonoscopy in the next 10 years unless the patient develops any      abnormal symptoms in the interim.  2.  Outpatient follow-up as need arises in the future.       JNM/MEDQ  D:  09/13/2004  T:  09/13/2004  Job:  [id number]   cc:   Genia Del, M.D.  [address]  [place]  [place] [address]  Fax: [phone]

## 2011-04-06 NOTE — Consult Note (Signed)
[name], [name]              ACCOUNT NO.:  [id number]   MEDICAL RECORD NO.:  [id number]          PATIENT TYPE:  OUT   LOCATION:  XRAY                         FACILITY:  [facility]   PHYSICIAN:  [name], M.D.DATE OF BIRTH:  [birth year]   DATE OF CONSULTATION:  12/31/2005  DATE OF DISCHARGE:                                   CONSULTATION   CHIEF COMPLAINT:  Compression fracture.   HISTORY OF PRESENT ILLNESS:  This is a very pleasant 77 year old female who  had a fall back in August at which time she suffered a shoulder injury.  Some time later she was bending over to pick something up and she developed  severe back pain.  She had an MRI performed on December 14 at [facility]  that showed a T7 compression fracture with some degree of retropulsion with  a mild superior end plate fracture at T6 as well.  The patient was seen by  Dr. Sharolyn [name].  A nerve root block was suggested and the patient was  referred to Dr. Corliss [name] for a possible kyphoplasty or vertebroplasty.  She  presents today with her husband to discuss this procedure.   PAST MEDICAL HISTORY:  1.  Hyperlipidemia.  2.  She has had palpitations in the past.  Apparently she was evaluated for      this and her symptoms were felt to be benign.  3.  She has had gastroesophageal reflux disease.  4.  She has hypothyroidism.  5.  She has osteoporosis and is on Fosamax.  6.  She has the above-noted shoulder injury in August of 2006.   SURGICAL HISTORY:  1.  Appendectomy.  2.  Cholecystectomy.   ALLERGIES:  She is allergic to PENICILLIN.   CURRENT MEDICATIONS:  Levoxyl, Fosamax.  She takes Prilosec occasionally.  She has been taking Tylenol for her pain.  She initially was placed on  Vicodin for her back pain; however, this caused stomach upset.  She was  later changed to Demerol which did give her relief; however, she stopped  taking this as she was afraid she would become dependent.   SOCIAL HISTORY:  The  patient is married.  She has two children.  She and her  husband live in [place].  She has a daughter in [place], [place].  She  has never been a smoker.  She drinks occasional wine.  She is a retired  [employer] operational officer.   FAMILY HISTORY:  Her mother died at age 82 from cancer.  Her father died at  age 50 when he was hit by a moving vehicle.   IMPRESSION AND PLAN:  As noted, this patient had an MRI performed November 01, 2005 that revealed a T7 compression fracture with a superior end plate  fracture at T6.  The patient has had a significant amount of pain since that  time, although she does feel her pain is improving.  The kyphoplasty as well  as vertebroplasty procedure were discussed in great detail with the patient  and her husband including the risks and benefits.  Dr. Corliss [name] gave the  patient the  option of having a kyphoplasty or vertebroplasty performed or  conservative treatment with no intervention hoping that the fracture would  heal on its own as she has already had some improvement in her pain.  The  patient and her husband have chosen to proceed with the kyphoplasty or  vertebroplasty and this procedure has been scheduled for this coming Friday.  All their questions were answered and she is to undergo a kyphoplasty or  possible vertebroplasty for relief of pain and stabilization of the  fracture.      [name], P.A.    ______________________________  [name]. [name], M.D.    DR/MEDQ  D:  12/31/2005  T:  12/31/2005  Job:  [id number]   cc:   [name], M.D.  Fax: [phone]   [name], M.D.  Fax: [phone]

## 2011-05-15 ENCOUNTER — Encounter (HOSPITAL_BASED_OUTPATIENT_CLINIC_OR_DEPARTMENT_OTHER): Payer: Medicare Other | Attending: General Surgery

## 2011-05-15 DIAGNOSIS — E039 Hypothyroidism, unspecified: Secondary | ICD-10-CM | POA: Insufficient documentation

## 2011-05-15 DIAGNOSIS — Z79899 Other long term (current) drug therapy: Secondary | ICD-10-CM | POA: Insufficient documentation

## 2011-05-15 DIAGNOSIS — L97309 Non-pressure chronic ulcer of unspecified ankle with unspecified severity: Secondary | ICD-10-CM | POA: Insufficient documentation

## 2011-05-15 DIAGNOSIS — Z7982 Long term (current) use of aspirin: Secondary | ICD-10-CM | POA: Insufficient documentation

## 2011-05-15 DIAGNOSIS — I8310 Varicose veins of unspecified lower extremity with inflammation: Secondary | ICD-10-CM | POA: Insufficient documentation

## 2011-05-15 DIAGNOSIS — L02419 Cutaneous abscess of limb, unspecified: Secondary | ICD-10-CM | POA: Insufficient documentation

## 2011-05-17 ENCOUNTER — Ambulatory Visit (HOSPITAL_COMMUNITY)
Admission: RE | Admit: 2011-05-17 | Discharge: 2011-05-17 | Disposition: A | Discharge status: Home / Self Care | Payer: Medicare Other | Source: Ambulatory Visit | Attending: General Surgery | Admitting: General Surgery

## 2011-05-17 ENCOUNTER — Other Ambulatory Visit (HOSPITAL_BASED_OUTPATIENT_CLINIC_OR_DEPARTMENT_OTHER): Payer: Self-pay | Admitting: General Surgery

## 2011-05-17 DIAGNOSIS — X58XXXA Exposure to other specified factors, initial encounter: Secondary | ICD-10-CM | POA: Insufficient documentation

## 2011-05-17 DIAGNOSIS — S22009A Unspecified fracture of unspecified thoracic vertebra, initial encounter for closed fracture: Secondary | ICD-10-CM | POA: Insufficient documentation

## 2011-05-17 DIAGNOSIS — R05 Cough: Secondary | ICD-10-CM | POA: Insufficient documentation

## 2011-05-17 DIAGNOSIS — T148XXA Other injury of unspecified body region, initial encounter: Secondary | ICD-10-CM

## 2011-05-17 DIAGNOSIS — R059 Cough, unspecified: Secondary | ICD-10-CM | POA: Insufficient documentation

## 2011-05-18 ENCOUNTER — Other Ambulatory Visit (HOSPITAL_BASED_OUTPATIENT_CLINIC_OR_DEPARTMENT_OTHER): Payer: Self-pay | Admitting: General Surgery

## 2011-05-21 ENCOUNTER — Encounter (INDEPENDENT_AMBULATORY_CARE_PROVIDER_SITE_OTHER): Payer: Medicare Other

## 2011-05-21 DIAGNOSIS — L97309 Non-pressure chronic ulcer of unspecified ankle with unspecified severity: Secondary | ICD-10-CM

## 2011-05-22 ENCOUNTER — Encounter (HOSPITAL_BASED_OUTPATIENT_CLINIC_OR_DEPARTMENT_OTHER): Payer: Medicare Other | Attending: General Surgery

## 2011-05-22 DIAGNOSIS — L97309 Non-pressure chronic ulcer of unspecified ankle with unspecified severity: Secondary | ICD-10-CM | POA: Insufficient documentation

## 2011-05-22 DIAGNOSIS — E039 Hypothyroidism, unspecified: Secondary | ICD-10-CM | POA: Insufficient documentation

## 2011-05-22 DIAGNOSIS — L02419 Cutaneous abscess of limb, unspecified: Secondary | ICD-10-CM | POA: Insufficient documentation

## 2011-05-22 DIAGNOSIS — L03119 Cellulitis of unspecified part of limb: Secondary | ICD-10-CM | POA: Insufficient documentation

## 2011-05-22 DIAGNOSIS — Z7982 Long term (current) use of aspirin: Secondary | ICD-10-CM | POA: Insufficient documentation

## 2011-05-22 DIAGNOSIS — Z79899 Other long term (current) drug therapy: Secondary | ICD-10-CM | POA: Insufficient documentation

## 2011-05-22 DIAGNOSIS — I8310 Varicose veins of unspecified lower extremity with inflammation: Secondary | ICD-10-CM | POA: Insufficient documentation

## 2011-05-24 ENCOUNTER — Encounter (INDEPENDENT_AMBULATORY_CARE_PROVIDER_SITE_OTHER): Payer: Medicare Other | Admitting: General Surgery

## 2011-05-28 NOTE — Procedures (Unsigned)
DUPLEX DEEP VENOUS EXAM - LOWER EXTREMITY  INDICATION:  Left lower extremity ulceration x4 weeks.  HISTORY:  Edema:  Yes. Trauma/Surgery:  No. Pain:  Yes. PE:  No. Previous DVT: Anticoagulants: Other:  DUPLEX EXAM:               CFV   SFV   PopV  PTV    GSV               R  L  R  L  R  L  R   L  R  L Thrombosis    o  o  o  o  o  o  o   o  o  o Spontaneous   +  +  +  +  +  +  +   +  +  + Phasic        +  +  +  +  +  +  +   +  +  + Augmentation  +  +  +  +  +  +  +   +  +  + Compressible  +  +  +  +  +  +  +   +  +  + Competent     o  o  o  o  o  o  +   +  +  o  Legend:  + - yes  o - no  p - partial  D - decreased  IMPRESSION: 1. No evidence of deep venous thrombosis or superficial venous     thrombophlebitis in the bilateral lower extremity. 2. Evidence of deep venous insufficiency in the bilateral common     femoral, superficial femoral and popliteal veins. 3. The right great saphenous vein is competent; there is reflux in the     left great saphenous vein.   _____________________________ Di Kindle. Edilia Bo, M.D.  LT/MEDQ  D:  05/21/2011  T:  05/21/2011  Job:  [id number]

## 2011-05-31 ENCOUNTER — Other Ambulatory Visit: Payer: Self-pay | Admitting: Family Medicine

## 2011-05-31 DIAGNOSIS — R9389 Abnormal findings on diagnostic imaging of other specified body structures: Secondary | ICD-10-CM

## 2011-06-04 ENCOUNTER — Ambulatory Visit
Admission: RE | Admit: 2011-06-04 | Discharge: 2011-06-04 | Disposition: A | Discharge status: Home / Self Care | Payer: Medicare Other | Source: Ambulatory Visit | Attending: Family Medicine | Admitting: Family Medicine

## 2011-06-04 DIAGNOSIS — R9389 Abnormal findings on diagnostic imaging of other specified body structures: Secondary | ICD-10-CM

## 2011-06-04 MED ORDER — IOHEXOL 300 MG/ML  SOLN: 75.0000 mL | Freq: Once | INTRAMUSCULAR | Status: AC | PRN

## 2011-06-26 ENCOUNTER — Encounter (HOSPITAL_BASED_OUTPATIENT_CLINIC_OR_DEPARTMENT_OTHER): Payer: Medicare Other | Attending: General Surgery

## 2011-06-26 DIAGNOSIS — L97309 Non-pressure chronic ulcer of unspecified ankle with unspecified severity: Secondary | ICD-10-CM | POA: Insufficient documentation

## 2011-06-26 DIAGNOSIS — I872 Venous insufficiency (chronic) (peripheral): Secondary | ICD-10-CM | POA: Insufficient documentation

## 2011-07-24 ENCOUNTER — Encounter (HOSPITAL_BASED_OUTPATIENT_CLINIC_OR_DEPARTMENT_OTHER): Payer: Medicare Other | Attending: General Surgery

## 2011-07-24 DIAGNOSIS — L97309 Non-pressure chronic ulcer of unspecified ankle with unspecified severity: Secondary | ICD-10-CM | POA: Insufficient documentation

## 2011-07-24 DIAGNOSIS — I872 Venous insufficiency (chronic) (peripheral): Secondary | ICD-10-CM | POA: Insufficient documentation

## 2011-09-11 NOTE — H&P (Signed)
  NAMEBRINDA, [name]              ACCOUNT NO.:  [id number]  MEDICAL RECORD NO.:  [id number]  LOCATION:  FOOT                         FACILITY:  [facility]  PHYSICIAN:  Joanne Gavel, M.D.        DATE OF BIRTH:  November 13, 1923  DATE OF ADMISSION:  05/15/2011 DATE OF DISCHARGE:                             HISTORY & PHYSICAL   CHIEF COMPLAINT:  Wound, left ankle.  HISTORY OF PRESENT ILLNESS:  This is an 77 year old female who had a fall in March 2012, evidently developed a wound of the ankle in addition to compression fractures of the lumbar spine and a severe wrist fracture, both of which required surgery.  She does have a history of osteoporosis.  After drainage, the patient has ended up with a nonhealing wound.  In addition, the patient has clear-cut stasis changes.  General health is good.  She is hypothyroid now after being treated with radioactive iodine for hyperthyroidism, and she has Sjogren syndrome.  PAST SURGICAL HISTORY:  She has had cholecystectomy and appendectomy.OPERATIONS:  On lumbar spine and wrist.  Cigarettes none.  Alcohol none.  MEDICATIONS:  Levoxyl, aspirin, calcium and vitamin D.  ALLERGIES:  PENICILLIN.  REVIEW OF SYSTEMS:  As above.  PHYSICAL EXAMINATION:  VITAL SIGNS:  Temperature 98.1, pulse 70, respirations 130/79. GENERAL APPEARANCE:  Well-developed, very slender, in no distress. HEENT:  Cranial normocephalic.  Eyes, ears, nose, and throat normal. NECK:  Supple. CHEST:  Clear. HEART:  Slight irregularity with occasional dropped beats. EXTREMITIES:  Good peripheral pulses.  There is a large superficial varicosity of the left saphenous vein, there are many tiny varicosities around the foot.  There is swelling of stasis changes around the ankle at the medial malleolus, and there is a 1.2 x 1.5 shaggy ulceration. Slough is cleared from this ulceration resulting in a much deeper wound than first appeared.  IMPRESSION:  Status post traumatic wound and  abscess in a patient with varicose venous disease and stasis dermatitis.  PLAN:  Treat now with Santyl and Unna boot.  I have referred to Vascular Surgery for the thoughts about possible ablation.     Joanne Gavel, M.D.     RA/MEDQ  D:  05/15/2011  T:  05/16/2011  Job:  [id number]  cc:   Juanetta Gosling, MD  Electronically Signed by Joanne Gavel M.D. on 09/11/2011 09:09:16 AM

## 2011-12-16 IMAGING — XA IR KYPHOPLASTY LUMBAR INIT
1 series · 14 of 18 positions shown · non-contrast
Comparison: MRI scan of the thoracolumbar spine of 02/21/2011.

CLINICAL DATA: Severe low back pain secondary to compression
fracture at T12.

KYPHOPLASTY AT T12

[Series 300: ir kyphoplasty or sacroplasty · 14 of 18 slices shown]
[im 1/18]
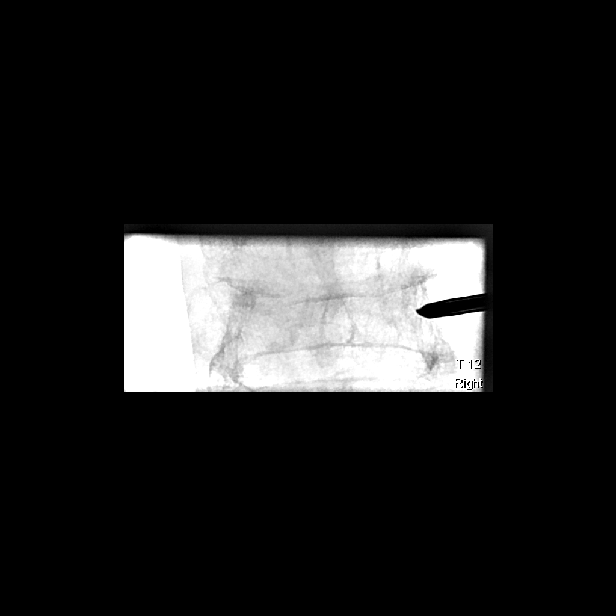
[im 2/18]
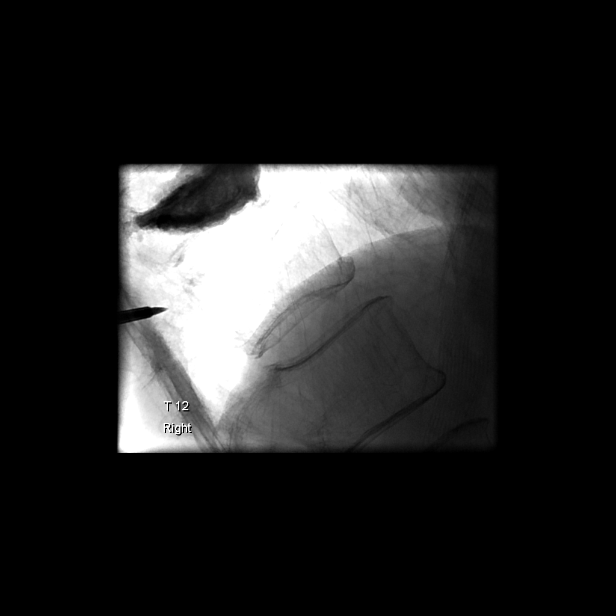
[im 4/18]
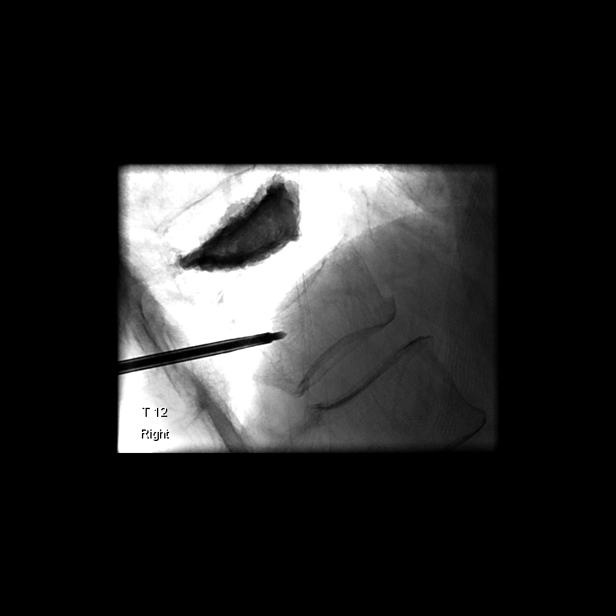
[im 5/18]
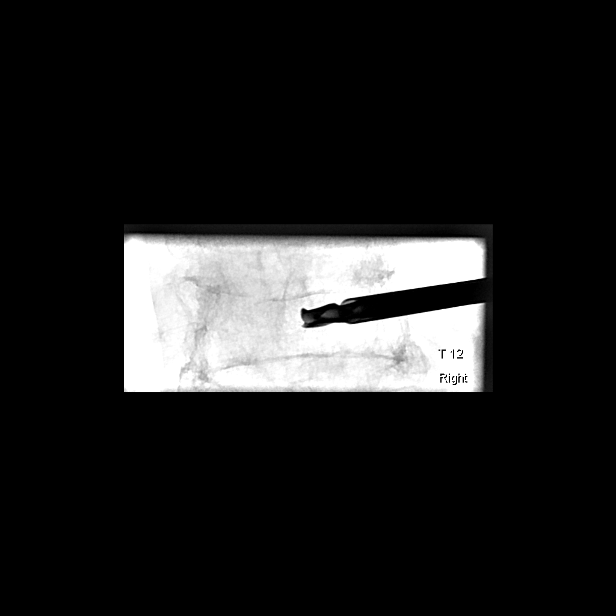
[im 6/18]
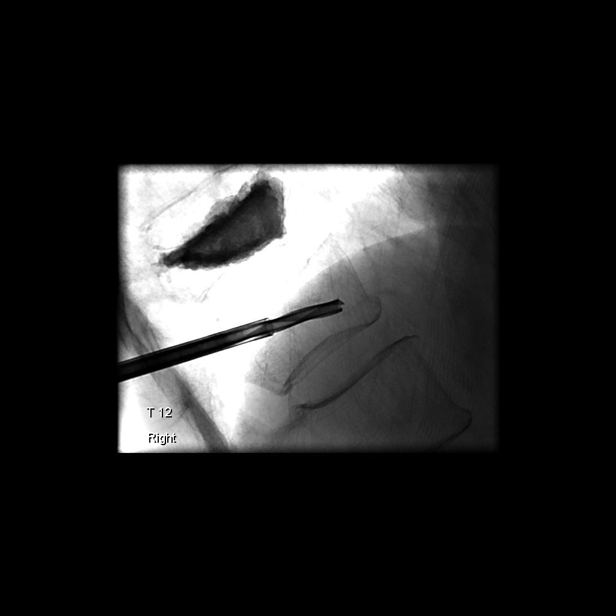
[im 8/18]
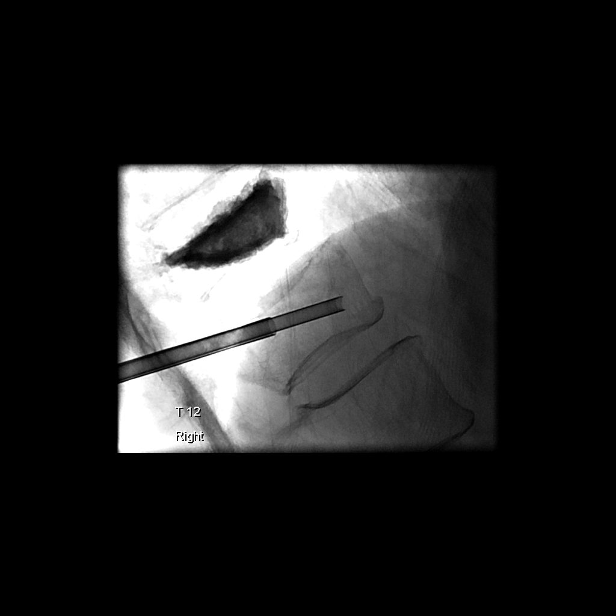
[im 9/18]
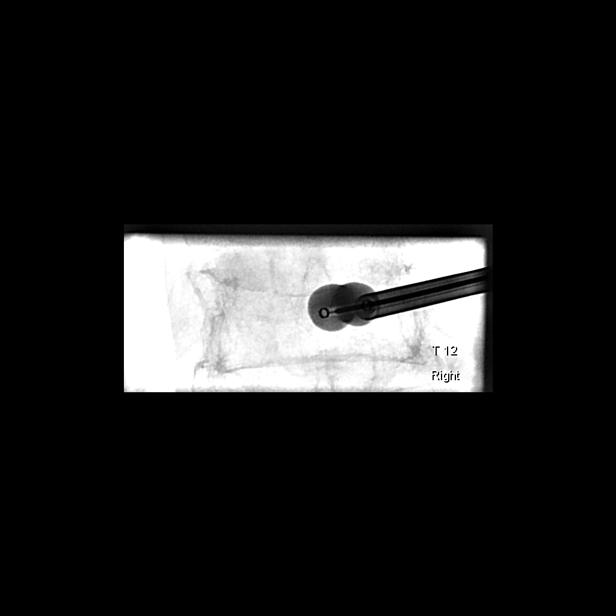
[im 10/18]
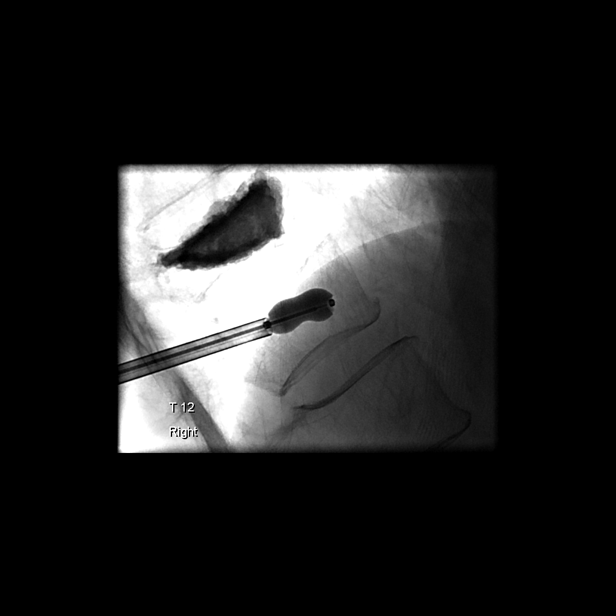
[im 11/18]
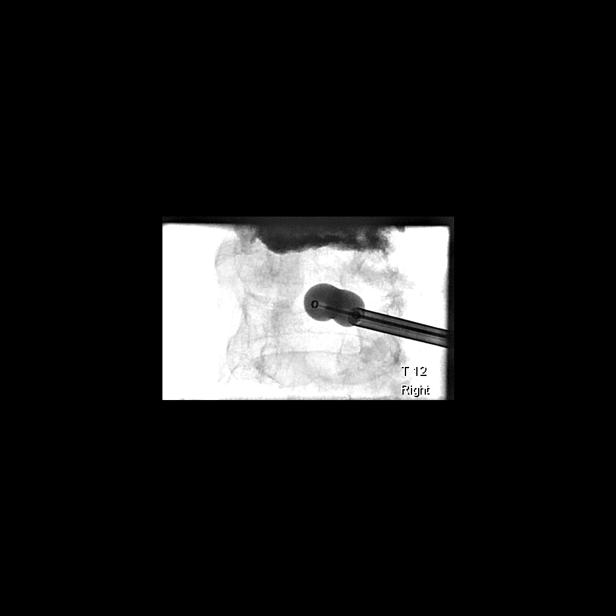
[im 13/18]
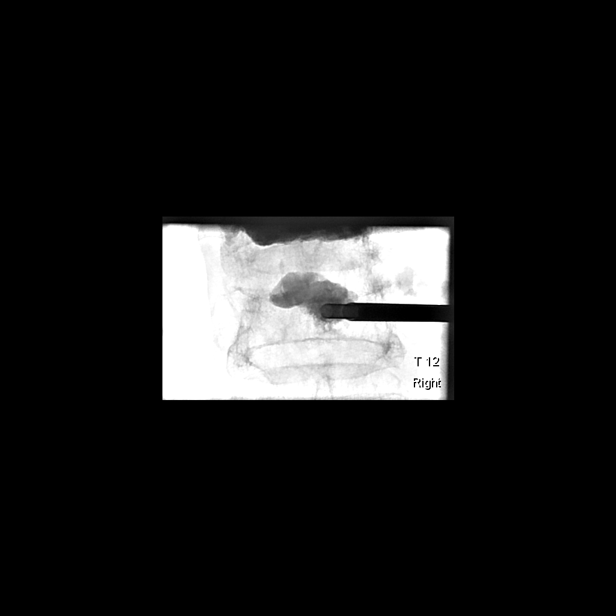
[im 14/18]
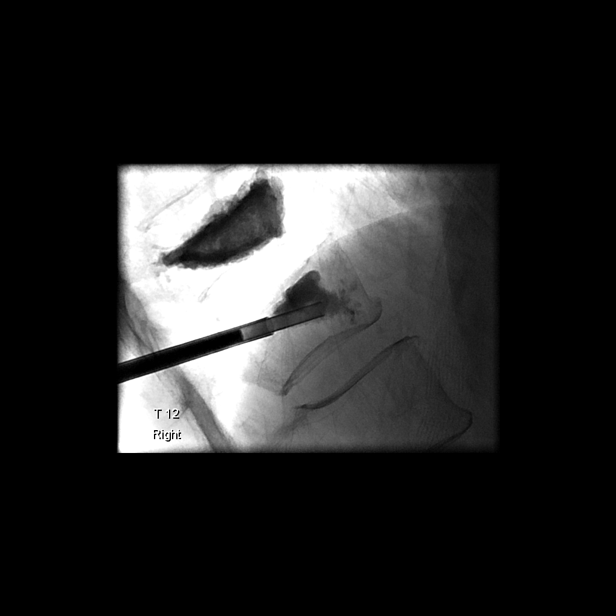
[im 15/18]
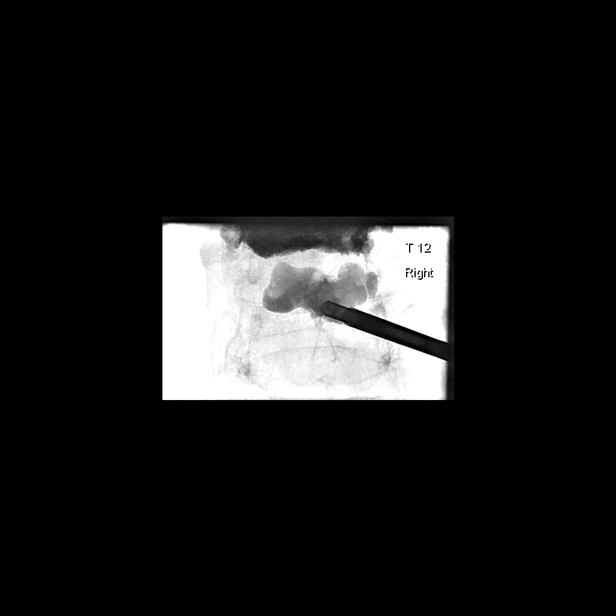
[im 17/18]
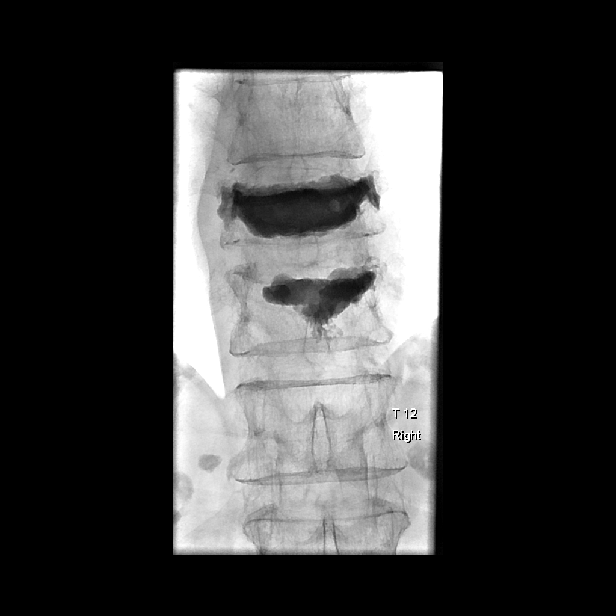
[im 18/18]
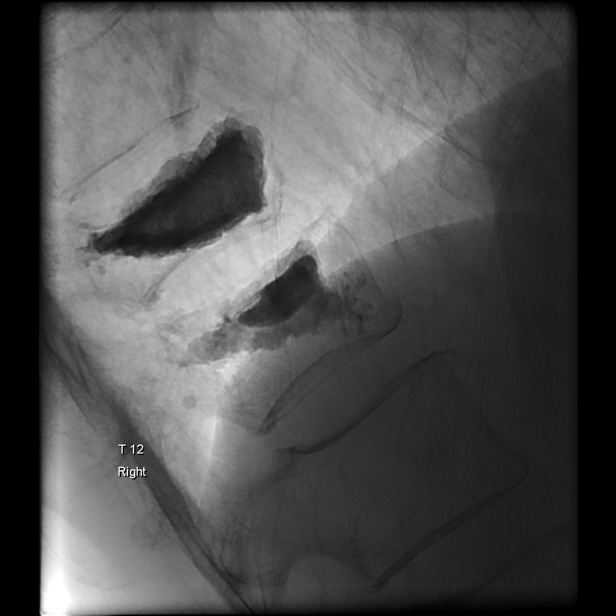

[14 of 18 positions shown; findings below may reference images not displayed]

Following a full explanation of the procedure along with the
potential associated complications, an informed witnessed consent
was obtained.

The patient was placed prone on the fluoroscopic table.  The skin
overlying the thoracolumbar region was then prepped and draped in
the usual sterile fashion.  The skin overlying the right pedicle at
T12 was then identified and infiltrated with 0.25% bupivacaine.

Using biplane intermittent fluoroscopy, an 11-gauge Jamshidi needle
was then advanced through the right pedicle into the posterior one-
third at T12.  This was then exchanged out for the Kyphon advanced
osteo introducer system comprising a working cannula and a Kyphon
osteo drill over a Kyphon osteo bone pin.

This combination was then advanced until the tip of the Kyphon
osteo drill was in the posterior third at T12.  At this time the
bone pin was removed.

In a medial trajectory the combination was advanced until the tip
of the working cannula was in the posterior third at T12.

The osteo drill was removed and a core sample sent for pathologic
analysis.

Through the working cannula, a Kyphon bone biopsy device was
advanced within 5 mm of the anterior aspect of T12.  A sample from
this was also sent for pathologic analysis.

Through the working cannula, a Kyphon inflatable bone tamp 20 x 3
was advanced and positioned with the distal marker 7 mm from the
anterior aspect of T12.  Crossing of the midline was noted.

This was then expanded with contrast using a Kyphon inflation
syringe device via microtubing, until there was apposition with the
superior endplate.

Methylmethacrylate mixture was reconstituted with Tobramycin in the
Kyphon bone mixing device system.  This was then loaded onto Kyphon
bone fillers.

The balloon was deflated and removed followed by the instillation
of 3 bone filler equivalents of methylmethacrylate mixture at T12.
Excellent filling was obtained in the AP and lateral projections.

There was no extrusion noted into disc spaces or posteriorly into
the spinal canal.

The working cannula and the bone filler were removed.  Hemostasis
was achieved at the skin entry site.

The patient tolerated the procedure well.  There were no acute
complications.

Medications utilized: Versed 2 mg IV.  Fentanyl 50 mg IV.

IMPRESSION
1.  Status post fluoroscopic-guided needle placement for deep core
bone biopsy at T12.
2.  Status post vertebral body augmentation for painful
osteoporotic compression fracture at T12 using the balloon
kyphoplasty technique.

## 2011-12-27 ENCOUNTER — Other Ambulatory Visit: Payer: Self-pay | Admitting: Dermatology

## 2015-04-05 ENCOUNTER — Emergency Department (HOSPITAL_COMMUNITY): Payer: Medicare Other

## 2015-04-05 ENCOUNTER — Emergency Department (HOSPITAL_COMMUNITY)
Admission: EM | Admit: 2015-04-05 | Discharge: 2015-04-05 | Disposition: A | Discharge status: Home / Self Care | Payer: Medicare Other | Attending: Emergency Medicine | Admitting: Emergency Medicine

## 2015-04-05 ENCOUNTER — Encounter (HOSPITAL_COMMUNITY): Payer: Self-pay | Admitting: Emergency Medicine

## 2015-04-05 DIAGNOSIS — W19XXXA Unspecified fall, initial encounter: Secondary | ICD-10-CM

## 2015-04-05 DIAGNOSIS — Y92007 Garden or yard of unspecified non-institutional (private) residence as the place of occurrence of the external cause: Secondary | ICD-10-CM | POA: Insufficient documentation

## 2015-04-05 DIAGNOSIS — W1839XA Other fall on same level, initial encounter: Secondary | ICD-10-CM | POA: Insufficient documentation

## 2015-04-05 DIAGNOSIS — S32010A Wedge compression fracture of first lumbar vertebra, initial encounter for closed fracture: Secondary | ICD-10-CM | POA: Diagnosis not present

## 2015-04-05 DIAGNOSIS — S3992XA Unspecified injury of lower back, initial encounter: Secondary | ICD-10-CM | POA: Diagnosis present

## 2015-04-05 DIAGNOSIS — Z8639 Personal history of other endocrine, nutritional and metabolic disease: Secondary | ICD-10-CM | POA: Insufficient documentation

## 2015-04-05 DIAGNOSIS — Z79899 Other long term (current) drug therapy: Secondary | ICD-10-CM | POA: Insufficient documentation

## 2015-04-05 DIAGNOSIS — Y998 Other external cause status: Secondary | ICD-10-CM | POA: Diagnosis not present

## 2015-04-05 DIAGNOSIS — Z880 Allergy status to penicillin: Secondary | ICD-10-CM | POA: Insufficient documentation

## 2015-04-05 DIAGNOSIS — Y9389 Activity, other specified: Secondary | ICD-10-CM | POA: Diagnosis not present

## 2015-04-05 HISTORY — DX: Disorder of thyroid, unspecified: E07.9

## 2015-04-05 MED ORDER — HYDROCODONE-ACETAMINOPHEN 5-325 MG PO TABS
1.0000 | ORAL_TABLET | Freq: Once | ORAL | Status: AC
  Administered 2015-04-05: 1 via ORAL
  Filled 2015-04-05: qty 1

## 2015-04-05 MED ORDER — KETOROLAC TROMETHAMINE 15 MG/ML IJ SOLN
15.0000 mg | Freq: Once | INTRAMUSCULAR | Status: AC
  Administered 2015-04-05: 15 mg via INTRAMUSCULAR
  Filled 2015-04-05: qty 1

## 2015-04-05 MED ORDER — ONDANSETRON 4 MG PO TBDP
4.0000 mg | ORAL_TABLET | Freq: Once | ORAL | Status: AC
  Administered 2015-04-05: 4 mg via ORAL
  Filled 2015-04-05: qty 1

## 2015-04-05 MED ORDER — ONDANSETRON 4 MG PO TBDP: 4.0000 mg | ORAL_TABLET | Freq: Three times a day (TID) | ORAL | Status: AC | PRN

## 2015-04-05 MED ORDER — TRAMADOL HCL 50 MG PO TABS: 50.0000 mg | ORAL_TABLET | Freq: Four times a day (QID) | ORAL | Status: DC | PRN

## 2015-04-05 NOTE — ED Notes (Signed)
Per ems pt from home, pt co lower back pain post fall on Sunday, pt fell on left side, no loc no head injury, no hip pain. No anticoagulant intake. Pt is alert and oriented x4. Pt is normally ambulatory.

## 2015-04-05 NOTE — Progress Notes (Signed)
ED CM spoke with EDP about pt. CM consulted ED SW

## 2015-04-05 NOTE — Discharge Instructions (Signed)
As discussed, today's evaluation has resulted in a diagnosis of lumbar compression fracture.  Typically these take some time to improve, and ongoing soreness is normal.  However, if you develop new, or concerning changes in your condition, please be sure to return here immediately for further evaluation and management.

## 2015-04-05 NOTE — ED Provider Notes (Signed)
CSN: [id number]     Arrival date & time 04/05/15  [id number] History   First MD Initiated Contact with Patient 04/05/15 1012     Chief Complaint  Patient presents with  . Back Pain    lower     (Consider location/radiation/quality/duration/timing/severity/associated sxs/prior Treatment) HPI Patient presents with concern of ongoing back pain. Patient had a mechanical fall 2 days ago. She was in the garden, fell onto her left low back. She has been in better, and with substantial pain in the lower back since the fall. She denies any head trauma, current headache, neck pain, visual changes, confusion, disorientation. No weakness in any extremity, no incontinence, abdominal pain. The pain is sore, focally in the low back, right-sided, radiation superiorly. Minimal relief with OTC medication. Past Medical History  Diagnosis Date  . Thyroid disease    History reviewed. No pertinent past surgical history. No family history on file. History  Substance Use Topics  . Smoking status: Not on file  . Smokeless tobacco: Not on file  . Alcohol Use: Not on file   OB History    No data available     Review of Systems  Constitutional:       Per HPI, otherwise negative  HENT:       Per HPI, otherwise negative  Respiratory:       Per HPI, otherwise negative  Cardiovascular:       Per HPI, otherwise negative  Gastrointestinal: Negative for vomiting.  Endocrine:       Negative aside from HPI  Genitourinary:       Neg aside from HPI   Musculoskeletal:       Per HPI, otherwise negative  Skin: Negative.   Neurological: Negative for syncope.      Allergies  Penicillins  Home Medications   Prior to Admission medications   Medication Sig Start Date End Date Taking? Authorizing Provider  Ascorbic Acid (VITAMIN C PO) Take 1 tablet by mouth daily.   Yes Historical Provider, MD  Cholecalciferol (VITAMIN D PO) Take 1 tablet by mouth daily.   Yes Historical Provider, MD  latanoprost  (XALATAN) 0.005 % ophthalmic solution Place 1 drop into both eyes at bedtime. 03/06/15  Yes Historical Provider, MD  Multiple Vitamins-Minerals (MULTIVITAMIN WITH MINERALS) tablet Take 1 tablet by mouth daily.   Yes Historical Provider, MD   BP 164/75 mmHg  Pulse 63  Temp(Src) 97.3 F (36.3 C) (Oral)  Resp 18  SpO2 97% Physical Exam  Constitutional: She is oriented to person, place, and time. She appears well-developed and well-nourished. No distress.  HENT:  Head: Normocephalic and atraumatic.  Eyes: Conjunctivae and EOM are normal.  Cardiovascular: Normal rate and regular rhythm.   Pulmonary/Chest: Effort normal and breath sounds normal. No stridor. No respiratory distress.  Abdominal: She exhibits no distension.  Musculoskeletal: She exhibits no edema.  Pelvis is stable, patient elevates each leg independently, with some pain referred to the mid low back.  Neurological: She is alert and oriented to person, place, and time. No cranial nerve deficit.  Skin: Skin is warm and dry.  Psychiatric: She has a normal mood and affect.  Nursing note and vitals reviewed.   ED Course  Procedures (including critical care time)  Imaging Review Dg Lumbar Spine Complete  04/05/2015   CLINICAL DATA:  Fall 2 days ago, low back pain  EXAM: LUMBAR SPINE - COMPLETE 4+ VIEW  COMPARISON:  02/21/2011  FINDINGS: Five views of lumbar spine submitted. There is  prior vertebroplasty at T11 and T12 level. There is interval mild compression fracture upper endplate of L1 vertebral body of indeterminate age. Clinical correlation is necessary. Diffuse osteopenia. Mild anterior spurring upper endplate of L2, L3 and L4 vertebral bodies. Alignment and disc spaces are preserved.  IMPRESSION: There is prior vertebroplasty at T11 and T12 level. There is interval mild compression deformity upper endplate of L1 vertebral body of indeterminate age. Clinical correlation is necessary. If recent fracture is suspected further  correlation with MRI could be performed.   Electronically Signed   By: Natasha MeadLiviu  Pop M.D.   On: 04/05/2015 11:30   on repeat exam the patient appears more calm.  On repeat exam the patient's husband is here.  Discussed all findings.  Patient has been ambulatory, though she has some discomfort. She, her husband and I discussed her compression fracture, as well as her history of prior compression fractures, treated with kyphoplasty.  On the state of the second kyphoplasty was complicated, resulted in substantial pain, and she is unlikely to want a similar intervention.  The patient had some nausea following provision of narcotics, Zofran provided.  On repeat exam, prior to discharge, the patient appears well, is interacting appropriate, no evidence for distress or neurologic compromise. I discussed patient's case with our case management team to facilitate either home health services or rehabilitation placement. Patient and husband defer offer of assistance with rehabilitation placement.   MDM   Final diagnoses:  Fall, initial encounter  Compression fracture of L1 lumbar vertebra, closed, initial encounter   Patient reason several days after mechanical fall with ongoing pain in the back. Patient is awake, alert, with no distal neurologic deficits. Patient has no evidence for occult infection, or imminent decompensation given her reassuring physical exam. Patient does have evidence for likely compression fracture of the lumbar spine. Patient had improvement here, and after discussion with case management with consideration of placement into a rehabilitation facility, the patient elected for discharge, with outpatient follow-up.  Gerhard Munchobert Ryot Burrous, MD 04/05/15 [phone]

## 2015-04-05 NOTE — ED Notes (Signed)
Bed: ZO10WA19 Expected date:  Expected time:  Means of arrival:  Comments: EMS- 81yo F, fall, back pain

## 2015-04-05 NOTE — Care Management Note (Addendum)
Case Management Note  Patient Details  Name: Boris LownCecile H Prentiss MRN: [id number] Date of Birth: 1923/05/18  Subjective/Objective:    81 yr old female with compression fracture with united health care coverage.  Pt's ED RN states pt does not want to go to facility but wants d/c home.   CM noted pt sitting up in bedside with her husband, dressed and ready for d/c Pt informed CM she did not want home health initiated by EDP She wants to make an appt with her pcp, V Rankins and then have the pcp to initiate home health She agreed to have CM send pcp a note via EPIC in basket Refused home health and private duty nursing               Action/Plan:  CM assessed pt for home needs.  CM reviewed in details medicare guidelines, home health [facility](HH) (length of stay in home, [facility] available, coverage, primary caregiver, up to 24 hrs before services may be started) and Private duty nursing (PDN-coverage, length of stay in the home types of staff available), assisted living (ASL- coverage, services offered) CM provided pt with a list of [place] home health agencies to share with her pcp  CM updated ED RN that pt wanted to speak with EDP and her and was ready to d/c home  Expected Discharge Date:    04/05/15              Expected Discharge Plan:    home without home health but to speak with pcp about initiating home health    Status of Service:   completed 1543 spoke with Pcp staff and faxed clinicals to Dr Barbaraann Barthelankins at [phone] CM received fax confirmation at 1553 for clinicals sent to pcp  1719 CM received a call from staff at pcp office, [facility] to request Cm call pt to tell her to see Dr Juliene PinaGeoffrey for her medical concerns that [facility]  1725 Cm sent Dr Darrelyn HillockGioffre an EPIC in basket note informing him of pt [facility] and Dr Luciana Axeankin request that pt see him 1742 CM spoke with Dr Luz BrazenHartman to update him that Dr Luciana Axeankin prefers pt to see Dr Darrelyn HillockGioffre Provided Dr Darrelyn HillockGioffre contact information Additional  Comments:  Ophelia ShoulderGibbs, Jordan Pardini Louise, RN 04/05/2015, 2:38 PM

## 2017-01-24 ENCOUNTER — Emergency Department (HOSPITAL_COMMUNITY): Payer: Medicare Other

## 2017-01-24 ENCOUNTER — Encounter (HOSPITAL_COMMUNITY): Payer: Self-pay | Admitting: Emergency Medicine

## 2017-01-24 ENCOUNTER — Emergency Department (HOSPITAL_COMMUNITY)
Admission: EM | Admit: 2017-01-24 | Discharge: 2017-01-24 | Disposition: A | Discharge status: Home / Self Care | Payer: Medicare Other | Attending: Emergency Medicine | Admitting: Emergency Medicine

## 2017-01-24 DIAGNOSIS — Y92002 Bathroom of unspecified non-institutional (private) residence single-family (private) house as the place of occurrence of the external cause: Secondary | ICD-10-CM | POA: Diagnosis not present

## 2017-01-24 DIAGNOSIS — Y999 Unspecified external cause status: Secondary | ICD-10-CM | POA: Insufficient documentation

## 2017-01-24 DIAGNOSIS — W19XXXA Unspecified fall, initial encounter: Secondary | ICD-10-CM

## 2017-01-24 DIAGNOSIS — M25531 Pain in right wrist: Secondary | ICD-10-CM

## 2017-01-24 DIAGNOSIS — M25561 Pain in right knee: Secondary | ICD-10-CM | POA: Diagnosis not present

## 2017-01-24 DIAGNOSIS — W1839XA Other fall on same level, initial encounter: Secondary | ICD-10-CM | POA: Insufficient documentation

## 2017-01-24 DIAGNOSIS — Z79899 Other long term (current) drug therapy: Secondary | ICD-10-CM | POA: Insufficient documentation

## 2017-01-24 DIAGNOSIS — Y939 Activity, unspecified: Secondary | ICD-10-CM | POA: Diagnosis not present

## 2017-01-24 NOTE — ED Triage Notes (Signed)
Patient and husband refused all medical interventions other than xray of knee

## 2017-01-24 NOTE — ED Triage Notes (Signed)
Pt reported to radiology that her r/arm is hurting her, limited movement noted by radiology

## 2017-01-24 NOTE — ED Triage Notes (Signed)
Husband requesting sandwiches for himself and the patient. Husband was given a sandiwch. Food not provided for patient until after evaluation of xrays

## 2017-01-24 NOTE — ED Triage Notes (Signed)
Symptoms of patient reviewed with PA. Triage orders initiated

## 2017-01-24 NOTE — ED Triage Notes (Addendum)
C/o r/knee pain , Denies fainting or loss of consciousness.Stated that she can not really remember what happened.  Pt stated that she was standing in her bathroom , lost her balance and fell to the floor. Pt got up off the floor without assistance.  Stated that she lost her balance . She struck her r/knee on the floor. No obvious injury to r/knee. Pt was ambulatory in the home , to the car.Pt is alert and oriented. Husband with pt.

## 2017-01-24 NOTE — Discharge Instructions (Signed)
Please see orthopedist Dr. Roda ShuttersXu with one 1 week regarding today's visit. Please use cold compress to the area for 15-20 minutes 3-5 times a day. Take Tylenol as needed for pain relief. Use Ace wrap to compress areas affected. Stay active. Please follow up with her primary care provider in 1 to 2 weeks regarding today's visit.  Get help right away if: Your knee feels warm to the touch. You cannot move your knee. You have severe pain in your knee or wrist. You have chest pain. You have trouble breathing.

## 2017-01-24 NOTE — ED Provider Notes (Signed)
[facility]-EMERGENCY DEPT Provider Note   CSN: [id number] Arrival date & time: 01/24/17  1159  By signing my name below, I, [name], attest that this documentation has been prepared under the direction and in the presence of [address]Tyliek Timberman [place], [place]. Electronically Signed: Sonum [name], Neurosurgeoncribe. 01/24/17. 2:17 PM.  History   Chief Complaint Chief Complaint  Patient presents with  . Fall  . Knee Pain    The history is provided by the patient and the spouse. No language interpreter was used.    ' HPI Comments: [name] is a 83 y.o. female with hx of compression fracture of L1 lumbar fracture one year ago after fall who presents to the Emergency Department complaining of a fall that occurred earlier today. Patient states she lost her balance and fell forwards, striking her right knee. She denies LOC. She currently complains of constant, unchanged right knee pain and redness along with right wrist pain. She denies weakness, numbness, fever, chills, nausea, vomiting. She denies blood thinner use. She denies any other complaints.   Past Medical History:  Diagnosis Date  . Thyroid disease     There are no active problems to display for this patient.   Past Surgical History:  Procedure Laterality Date  . APPENDECTOMY    . BACK SURGERY    . TONSILLECTOMY      OB History    No data available       Home Medications    Prior to Admission medications   Medication Sig Start Date End Date Taking? Authorizing Provider  Ascorbic Acid (VITAMIN C PO) Take 1 tablet by mouth daily.    Historical Provider, MD  Cholecalciferol (VITAMIN D PO) Take 1 tablet by mouth daily.    Historical Provider, MD  latanoprost (XALATAN) 0.005 % ophthalmic solution Place 1 drop into both eyes at bedtime. 03/06/15   Historical Provider, MD  levothyroxine (SYNTHROID, LEVOTHROID) 75 MCG tablet Take 75 mcg by mouth daily before breakfast.    Historical Provider, MD  Multiple Vitamins-Minerals (MULTIVITAMIN  WITH MINERALS) tablet Take 1 tablet by mouth daily.    Historical Provider, MD  ondansetron (ZOFRAN ODT) 4 MG disintegrating tablet Take 1 tablet (4 mg total) by mouth every 8 (eight) hours as needed for nausea or vomiting. 04/05/15   Gerhard Munchobert Lockwood, MD  traMADol (ULTRAM) 50 MG tablet Take 1 tablet (50 mg total) by mouth every 6 (six) hours as needed. 04/05/15   Gerhard Munchobert Lockwood, MD    Family History Family History  Problem Relation Age of Onset  . Cancer Mother     Social History Social History  Substance Use Topics  . Smoking status: Never Smoker  . Smokeless tobacco: Never Used  . Alcohol use Yes     Comment: occ     Allergies   Penicillins   Review of Systems Review of Systems  Constitutional: Negative for chills and fever.  Gastrointestinal: Negative for nausea and vomiting.  Musculoskeletal: Positive for arthralgias and joint swelling.  Skin: Negative for wound.  Neurological: Negative for weakness and numbness.  All other systems reviewed and are negative.    Physical Exam Updated Vital Signs BP 149/83 (BP Location: Left Arm)   Pulse 80   Temp 98 F (36.7 C) (Oral)   Resp 20   Wt 45.4 kg   SpO2 98%   Physical Exam  Constitutional: She is oriented to person, place, and time. She appears well-developed and well-nourished.  Well-appearing  HENT:  Head: Normocephalic and atraumatic.  Neck: Normal range of motion. No tracheal deviation present.  Cardiovascular: Normal rate, normal heart sounds and intact distal pulses.   Pulmonary/Chest: Effort normal and breath sounds normal. No respiratory distress.  Musculoskeletal: Normal range of motion. She exhibits tenderness. She exhibits no deformity.  Right knee: Mild redness over patella with mild tenderness to palpation. Mild swelling noted. No abrasion or break in skin. No deformity. Full ROM. Muscle strength 5/5. Sensation and distal pulses intact. Right wrist: mild tenderness to palpation. No abrasion or break in  skin. No deformity. Full ROM. Muscle strength 5/5. Sensation and distal pulses intact.  No Midline tenderness cervical, thoracic, lumbar spine. No paraspinal tenderness. No tenderness to pelvis on palpation. There is chronic kyphosis noted.  Neurological: She is alert and oriented to person, place, and time.  Skin: Skin is warm and dry. There is erythema.  Psychiatric: She has a normal mood and affect. Her behavior is normal.  Nursing note and vitals reviewed.    ED Treatments / Results  DIAGNOSTIC STUDIES: Oxygen Saturation is 96% on RA, adequate by my interpretation.    COORDINATION OF CARE: 2:12 PM Patient and husband only desire XRAYs and no further testing at this time. Their only concern is for fractured bones and desire no other workup studies at this time. They are willing to sign any papers necessary. Discussed treatment plan with pt at bedside and pt agreed to plan. Given return precautions and advised to follow up with PCP.    Labs (all labs ordered are listed, but only abnormal results are displayed) Labs Reviewed - No data to display  EKG  EKG Interpretation None       Radiology Dg Forearm Right  Result Date: 01/24/2017 CLINICAL DATA:  Right forearm pain due to a fall this morning in a bathroom. Initial encounter. EXAM: RIGHT FOREARM - 2 VIEW COMPARISON:  None. FINDINGS: There is no evidence of fracture or other focal bone lesions. Soft tissues are unremarkable. IMPRESSION: Negative exam. Electronically Signed   By: Drusilla Kanner M.D.   On: 01/24/2017 13:17   Dg Wrist Complete Right  Result Date: 01/24/2017 CLINICAL DATA:  Right wrist pain due to a fall in the bathroom this morning. Initial encounter. EXAM: RIGHT WRIST - COMPLETE 3+ VIEW COMPARISON:  None. FINDINGS: No acute bony or joint abnormality is identified. 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 Chondrocalcinosis noted. IMPRESSION: No acute abnormality. Electronically Signed   By: Drusilla Kanner M.D.   On:  01/24/2017 13:18   Dg Knee Complete 4 Views Right  Result Date: 01/24/2017 CLINICAL DATA:  Syncope, fell in the bathroom this morning on hardwood floor, right knee bruising EXAM: RIGHT KNEE - COMPLETE 4+ VIEW COMPARISON:  None. FINDINGS: Four views of the right knee submitted. No acute fracture or subluxation. Mild narrowing of medial joint compartment. There is diffuse osteopenia. Small joint effusion. Mild prepatellar soft tissue swelling. There is mild soft tissue swelling anterior to proximal tibia. Atherosclerotic calcifications of popliteal artery. IMPRESSION: No acute fracture or subluxation. Anterior soft tissue swelling. Diffuse osteopenia. Mild narrowing of medial joint compartment. Small joint effusion. Electronically Signed   By: Natasha Mead M.D.   On: 01/24/2017 13:17    Procedures Procedures (including critical care time)  Medications Ordered in ED Medications - No data to display   Initial Impression / Assessment and Plan / ED Course  I have reviewed the triage vital signs and the nursing notes.  Pertinent labs & imaging results that were available during my  care of the patient were reviewed by me and considered in my medical decision making (see chart for details).     Patient X-Ray negative for obvious fracture or dislocation.  Patient and her husband both specifically asking to not have any lab work done or any other further assessment besides the x-rays for the knee and the wrist and willing to sign any paperwork regarding that. This was told to the nurse and to myself. Pt advised to follow up with orthopedics within a week. Patient given ace wrap to knee and thumb spica wrist splint while in ED, conservative therapy recommended and discussed. Patient will be discharged home & is agreeable with above plan. Returns precautions discussed. Pt appears safe for discharge. Reasons to immediately return to the department discussed. Patient also seen and evaluated by Dr. Eudelia Bunch who  agreed with assessment and plan.  Final Clinical Impressions(s) / ED Diagnoses   Final diagnoses:  Acute pain of right knee  Right wrist pain  Fall, initial encounter    New Prescriptions Discharge Medication List as of 01/24/2017  2:49 PM     I personally performed the services described in this documentation, which was scribed in my presence. The recorded information has been reviewed and is accurate.   [address] [place], [place] 01/24/17 1512    Nira Conn, MD 01/25/17 [phone]

## 2019-04-01 ENCOUNTER — Ambulatory Visit
Admission: RE | Admit: 2019-04-01 | Discharge: 2019-04-01 | Disposition: A | Discharge status: Home / Self Care | Payer: Medicare Other | Source: Ambulatory Visit | Attending: Family Medicine | Admitting: Family Medicine

## 2019-04-01 ENCOUNTER — Other Ambulatory Visit: Payer: Self-pay | Admitting: Family Medicine

## 2019-04-01 ENCOUNTER — Other Ambulatory Visit: Payer: Self-pay

## 2019-04-01 DIAGNOSIS — R102 Pelvic and perineal pain: Secondary | ICD-10-CM

## 2020-10-17 ENCOUNTER — Encounter (HOSPITAL_COMMUNITY): Payer: Self-pay

## 2020-10-17 ENCOUNTER — Other Ambulatory Visit: Payer: Self-pay

## 2020-10-17 ENCOUNTER — Emergency Department (HOSPITAL_COMMUNITY)
Admission: EM | Admit: 2020-10-17 | Discharge: 2020-10-17 | Disposition: A | Discharge status: Home / Self Care | Payer: Medicare Other | Attending: Emergency Medicine | Admitting: Emergency Medicine

## 2020-10-17 ENCOUNTER — Emergency Department (HOSPITAL_COMMUNITY): Payer: Medicare Other

## 2020-10-17 DIAGNOSIS — W19XXXA Unspecified fall, initial encounter: Secondary | ICD-10-CM | POA: Insufficient documentation

## 2020-10-17 DIAGNOSIS — M549 Dorsalgia, unspecified: Secondary | ICD-10-CM | POA: Insufficient documentation

## 2020-10-17 DIAGNOSIS — F0390 Unspecified dementia without behavioral disturbance: Secondary | ICD-10-CM | POA: Insufficient documentation

## 2020-10-17 DIAGNOSIS — S0083XA Contusion of other part of head, initial encounter: Secondary | ICD-10-CM | POA: Diagnosis not present

## 2020-10-17 DIAGNOSIS — S0990XA Unspecified injury of head, initial encounter: Secondary | ICD-10-CM | POA: Diagnosis present

## 2020-10-17 DIAGNOSIS — S0093XA Contusion of unspecified part of head, initial encounter: Secondary | ICD-10-CM

## 2020-10-17 HISTORY — DX: Unspecified dementia, unspecified severity, without behavioral disturbance, psychotic disturbance, mood disturbance, and anxiety: F03.90

## 2020-10-17 LAB — CBC
HCT: 45.2 % (ref 36.0–46.0)
Hemoglobin: 14.7 g/dL (ref 12.0–15.0)
MCH: 30.8 pg (ref 26.0–34.0)
MCHC: 32.5 g/dL (ref 30.0–36.0)
MCV: 94.6 fL (ref 80.0–100.0)
Platelets: 200 10*3/uL (ref 150–400)
RBC: 4.78 MIL/uL (ref 3.87–5.11)
RDW: 13.4 % (ref 11.5–15.5)
WBC: 6.4 10*3/uL (ref 4.0–10.5)
nRBC: 0 % (ref 0.0–0.2)

## 2020-10-17 LAB — BASIC METABOLIC PANEL
Anion gap: 13 (ref 5–15)
BUN: 44 mg/dL — ABNORMAL HIGH (ref 8–23)
CO2: 24 mmol/L (ref 22–32)
Calcium: 9.4 mg/dL (ref 8.9–10.3)
Chloride: 104 mmol/L (ref 98–111)
Creatinine, Ser: 1.67 mg/dL — ABNORMAL HIGH (ref 0.44–1.00)
GFR, Estimated: 28 mL/min — ABNORMAL LOW (ref >60)
Glucose, Bld: 106 mg/dL — ABNORMAL HIGH (ref 70–99)
Potassium: 3.9 mmol/L (ref 3.5–5.1)
Sodium: 141 mmol/L (ref 135–145)

## 2020-10-17 NOTE — ED Notes (Signed)
Spoke with pts daughter Jewel Baize to inquire if some one would be picking up her parents at discharge. Pts daughter states that her father has a vehicle here and would be driving .

## 2020-10-17 NOTE — ED Notes (Signed)
Pt ambulatory with standby assistance from triage.

## 2020-10-17 NOTE — Discharge Instructions (Addendum)
The CT scan did not show any signs of serious injury.  Follow-up with your doctor for further evaluation if the symptoms persist

## 2020-10-17 NOTE — ED Notes (Signed)
Per doctor discretion stated pt can be removed from monitoring

## 2020-10-17 NOTE — ED Notes (Signed)
pT refuses repeat vitals. Pt states "I was just visiting someone I am not a patient, get these things off of me". Pts husband also increasingly angry that they are still here in the hospital. It was explained that we are just awaiting results from her scans and that the doctor would be in to review the results.

## 2020-10-17 NOTE — ED Triage Notes (Signed)
Patient had a fall 4 days ago. Patient's daughter was with her at this time. Patient has a hematoma to the left side of her head. Patient's caretaker came in today and patient is more confused than usual. Patient hasdementia and is HOH. Patient is not on blood thinners. Patient's family and husband has been giving the patient aspirin for a headache.

## 2020-10-17 NOTE — ED Provider Notes (Signed)
[facility]-EMERGENCY DEPT Provider Note   CSN: [id number] Arrival date & time: 10/17/20  1248     History Chief Complaint  Patient presents with  . Fall    [name] is a 86 y.o. female.  HPI   Patient has a history of dementia but is cared for by the patient's daughter as well as a caregiver.  Patient fell sometime 4 days ago.  According to the caregiver the daughter was with her at that time.  They were not too concerned about the injury initially.  Family however asked the caregiver to come bring the patient to be checked out as they do feel that she is a little bit more confused than normal today.  Patient is not having any fevers or chills.  She is not having any vomiting or diarrhea.  She was having some back pain earlier this morning but she does have chronic back pain and it is not bothering her now.   Past Medical History:  Diagnosis Date  . Dementia (HCC)   . Thyroid disease     There are no problems to display for this patient.   Past Surgical History:  Procedure Laterality Date  . APPENDECTOMY    . BACK SURGERY    . TONSILLECTOMY       OB History   No obstetric history on file.     Family History  Problem Relation Age of Onset  . Cancer Mother     Social History   Tobacco Use  . Smoking status: Never Smoker  . Smokeless tobacco: Never Used  Vaping Use  . Vaping Use: Never used  Substance Use Topics  . Alcohol use: Yes    Comment: occ  . Drug use: Never    Home Medications Prior to Admission medications   Medication Sig Start Date End Date Taking? Authorizing Provider  latanoprost (XALATAN) 0.005 % ophthalmic solution Place 1 drop into both eyes at bedtime. 03/06/15  Yes [provider]  levothyroxine (SYNTHROID, LEVOTHROID) 75 MCG tablet Take 75 mcg by mouth daily before breakfast.   Yes [provider]  Ascorbic Acid (VITAMIN C PO) Take 1 tablet by mouth daily.    [provider]    Cholecalciferol (VITAMIN D PO) Take 1 tablet by mouth daily.    [provider]  Multiple Vitamins-Minerals (MULTIVITAMIN WITH MINERALS) tablet Take 1 tablet by mouth daily.    [provider]  ondansetron (ZOFRAN ODT) 4 MG disintegrating tablet Take 1 tablet (4 mg total) by mouth every 8 (eight) hours as needed for nausea or vomiting. 04/05/15   Gerhard Munch, MD  traMADol (ULTRAM) 50 MG tablet Take 1 tablet (50 mg total) by mouth every 6 (six) hours as needed. 04/05/15   Gerhard Munch, MD    Allergies    Penicillins  Review of Systems   Review of Systems  Constitutional: Negative for fever.  Respiratory: Negative for shortness of breath.   Cardiovascular: Negative for chest pain.  Gastrointestinal: Negative for abdominal pain.  All other systems reviewed and are negative.   Physical Exam Updated Vital Signs BP 112/84   Pulse 77   Temp 97.6 F (36.4 C) (Oral)   Resp 18   Ht 1.448 m (4\' 9" )   Wt 43.5 kg   SpO2 97%   BMI 20.77 kg/m   Physical Exam Vitals and nursing note reviewed.  Constitutional:      General: She is awake.     Appearance: She  is not toxic-appearing.     Comments: Frail, underweight  HENT:     Head: Normocephalic.     Comments: Faint bruising noted around the forehead    Right Ear: External ear normal.     Left Ear: External ear normal.  Eyes:     General: No scleral icterus.       Right eye: No discharge.        Left eye: No discharge.     Conjunctiva/sclera: Conjunctivae normal.  Neck:     Trachea: No tracheal deviation.  Cardiovascular:     Rate and Rhythm: Normal rate and regular rhythm.  Pulmonary:     Effort: Pulmonary effort is normal. No respiratory distress.     Breath sounds: Normal breath sounds. No stridor. No wheezing or rales.  Abdominal:     General: Bowel sounds are normal. There is no distension.     Palpations: Abdomen is soft.     Tenderness: There is no abdominal tenderness. There is no guarding or  rebound.  Musculoskeletal:        General: No tenderness.     Cervical back: Neck supple. No tenderness. Normal range of motion.     Thoracic back: No tenderness.     Lumbar back: No tenderness.  Skin:    General: Skin is warm and dry.     Findings: No rash.  Neurological:     Mental Status: She is alert.     Cranial Nerves: No cranial nerve deficit (no facial droop, extraocular movements intact, no slurred speech).     Sensory: No sensory deficit.     Motor: No abnormal muscle tone or seizure activity.     Coordination: Coordination normal.     ED Results / Procedures / Treatments   Labs (all labs ordered are listed, but only abnormal results are displayed) Labs Reviewed  BASIC METABOLIC PANEL - Abnormal; Notable for the following components:      Result Value   Glucose, Bld 106 (*)    BUN 44 (*)    Creatinine, Ser 1.67 (*)    GFR, Estimated 28 (*)    All other components within normal limits  CBC    EKG None  Radiology CT Head Wo Contrast  Result Date: 10/17/2020 CLINICAL DATA:  Patient fell 4 days ago. EXAM: CT HEAD WITHOUT CONTRAST CT CERVICAL SPINE WITHOUT CONTRAST TECHNIQUE: Multidetector CT imaging of the head and cervical spine was performed following the standard protocol without intravenous contrast. Multiplanar CT image reconstructions of the cervical spine were also generated. COMPARISON:  01/20/2011 FINDINGS: CT HEAD FINDINGS Brain: There is no evidence for acute hemorrhage, hydrocephalus, mass lesion, or abnormal extra-axial fluid collection. No definite CT evidence for acute infarction. Diffuse loss of parenchymal volume is consistent with atrophy. Patchy low attenuation in the deep hemispheric and periventricular white matter is nonspecific, but likely reflects chronic microvascular ischemic demyelination. Vascular: No hyperdense vessel or unexpected calcification. Skull: No evidence for fracture. No worrisome lytic or sclerotic lesion. Sinuses/Orbits: The  visualized paranasal sinuses and mastoid air cells are clear. Visualized portions of the globes and intraorbital fat are unremarkable. Other: None. CT CERVICAL SPINE FINDINGS Alignment: Marked accentuation of normal cervical lordosis. No substantial subluxation. Skull base and vertebrae: Bones are diffusely demineralized. No evidence for an acute fracture. No worrisome lytic or sclerotic osseous abnormality. Soft tissues and spinal canal: Unremarkable. Disc levels:  Mild loss of disc height noted C6-7. Upper chest: Marked compression deformity noted at T2. Mild compression  deformity noted at T5 with evidence of vertebral augmentation at T6. Other: None. IMPRESSION: 1. No acute intracranial abnormality. Atrophy with chronic small vessel white matter ischemic disease. 2. Degenerative changes in the cervical spine without evidence for an acute fracture. 3. Marked compression deformity at T2. Mild compression deformity at T5 with evidence of vertebral augmentation at T6. Electronically Signed   By: Kennith Center M.D.   On: 10/17/2020 16:57   CT Cervical Spine Wo Contrast  Result Date: 10/17/2020 CLINICAL DATA:  Patient fell 4 days ago. EXAM: CT HEAD WITHOUT CONTRAST CT CERVICAL SPINE WITHOUT CONTRAST TECHNIQUE: Multidetector CT imaging of the head and cervical spine was performed following the standard protocol without intravenous contrast. Multiplanar CT image reconstructions of the cervical spine were also generated. COMPARISON:  01/20/2011 FINDINGS: CT HEAD FINDINGS Brain: There is no evidence for acute hemorrhage, hydrocephalus, mass lesion, or abnormal extra-axial fluid collection. No definite CT evidence for acute infarction. Diffuse loss of parenchymal volume is consistent with atrophy. Patchy low attenuation in the deep hemispheric and periventricular white matter is nonspecific, but likely reflects chronic microvascular ischemic demyelination. Vascular: No hyperdense vessel or unexpected calcification.  Skull: No evidence for fracture. No worrisome lytic or sclerotic lesion. Sinuses/Orbits: The visualized paranasal sinuses and mastoid air cells are clear. Visualized portions of the globes and intraorbital fat are unremarkable. Other: None. CT CERVICAL SPINE FINDINGS Alignment: Marked accentuation of normal cervical lordosis. No substantial subluxation. Skull base and vertebrae: Bones are diffusely demineralized. No evidence for an acute fracture. No worrisome lytic or sclerotic osseous abnormality. Soft tissues and spinal canal: Unremarkable. Disc levels:  Mild loss of disc height noted C6-7. Upper chest: Marked compression deformity noted at T2. Mild compression deformity noted at T5 with evidence of vertebral augmentation at T6. Other: None. IMPRESSION: 1. No acute intracranial abnormality. Atrophy with chronic small vessel white matter ischemic disease. 2. Degenerative changes in the cervical spine without evidence for an acute fracture. 3. Marked compression deformity at T2. Mild compression deformity at T5 with evidence of vertebral augmentation at T6. Electronically Signed   By: Kennith Center M.D.   On: 10/17/2020 16:57    Procedures Procedures (including critical care time)  Medications Ordered in ED Medications - No data to display  ED Course  I have reviewed the triage vital signs and the nursing notes.  Pertinent labs & imaging results that were available during my care of the patient were reviewed by me and considered in my medical decision making (see chart for details).  Clinical Course as of Oct 17 1702  Mon Oct 17, 2020  1630 Pt and husband are upset that they are waiting here.  Pt wants the monitors taken off.   States we are doing unnecessary tests.  I explained that I have ordered. The CT scans and I am waiting for them to be done.   [JK]    Clinical Course User Index [JK] Linwood Dibbles, MD   MDM Rules/Calculators/A&P                          Patient's head CT does not show  any acute findings.  CT scan does show compression deformities in her thoracic spine but these are most likely chronic.  Patient is complaining of back pain to me today.  She is extremely kyphotic.  Patient likely has a component of dementia with her anger outburst.  No signs of acute infection.  Laboratory tests are unremarkable.  Patient stable for discharge.  She does have caregivers at home. Final Clinical Impression(s) / ED Diagnoses Final diagnoses:  Contusion of head, unspecified part of head, initial encounter    Rx / DC Orders ED Discharge Orders    None       Linwood Dibbles, MD 10/17/20 1704

## 2021-01-04 DIAGNOSIS — R636 Underweight: Secondary | ICD-10-CM | POA: Diagnosis not present

## 2021-01-04 DIAGNOSIS — E039 Hypothyroidism, unspecified: Secondary | ICD-10-CM | POA: Diagnosis not present

## 2021-01-04 DIAGNOSIS — K219 Gastro-esophageal reflux disease without esophagitis: Secondary | ICD-10-CM | POA: Diagnosis not present

## 2021-03-22 DIAGNOSIS — Z0000 Encounter for general adult medical examination without abnormal findings: Secondary | ICD-10-CM | POA: Diagnosis not present

## 2021-03-22 DIAGNOSIS — E039 Hypothyroidism, unspecified: Secondary | ICD-10-CM | POA: Diagnosis not present

## 2021-03-22 DIAGNOSIS — M810 Age-related osteoporosis without current pathological fracture: Secondary | ICD-10-CM | POA: Diagnosis not present

## 2021-03-27 DIAGNOSIS — Z961 Presence of intraocular lens: Secondary | ICD-10-CM | POA: Diagnosis not present

## 2021-03-27 DIAGNOSIS — H5213 Myopia, bilateral: Secondary | ICD-10-CM | POA: Diagnosis not present

## 2021-09-11 ENCOUNTER — Ambulatory Visit (HOSPITAL_COMMUNITY)
Admission: EM | Admit: 2021-09-11 | Discharge: 2021-09-11 | Discharge status: Against Medical Advice | Payer: Medicare Other

## 2021-09-11 ENCOUNTER — Other Ambulatory Visit: Payer: Self-pay

## 2021-09-12 ENCOUNTER — Encounter (HOSPITAL_BASED_OUTPATIENT_CLINIC_OR_DEPARTMENT_OTHER): Payer: Self-pay | Admitting: Emergency Medicine

## 2021-09-12 ENCOUNTER — Emergency Department (HOSPITAL_BASED_OUTPATIENT_CLINIC_OR_DEPARTMENT_OTHER): Payer: Medicare Other | Admitting: Radiology

## 2021-09-12 ENCOUNTER — Emergency Department (HOSPITAL_BASED_OUTPATIENT_CLINIC_OR_DEPARTMENT_OTHER): Payer: Medicare Other

## 2021-09-12 ENCOUNTER — Emergency Department (HOSPITAL_BASED_OUTPATIENT_CLINIC_OR_DEPARTMENT_OTHER)
Admission: EM | Admit: 2021-09-12 | Discharge: 2021-09-12 | Disposition: A | Discharge status: Home / Self Care | Payer: Medicare Other | Attending: Emergency Medicine | Admitting: Emergency Medicine

## 2021-09-12 ENCOUNTER — Other Ambulatory Visit: Payer: Self-pay

## 2021-09-12 DIAGNOSIS — S199XXA Unspecified injury of neck, initial encounter: Secondary | ICD-10-CM | POA: Diagnosis not present

## 2021-09-12 DIAGNOSIS — F0390 Unspecified dementia without behavioral disturbance: Secondary | ICD-10-CM | POA: Diagnosis not present

## 2021-09-12 DIAGNOSIS — W1830XA Fall on same level, unspecified, initial encounter: Secondary | ICD-10-CM | POA: Diagnosis not present

## 2021-09-12 DIAGNOSIS — R627 Adult failure to thrive: Secondary | ICD-10-CM | POA: Diagnosis not present

## 2021-09-12 DIAGNOSIS — Z043 Encounter for examination and observation following other accident: Secondary | ICD-10-CM | POA: Diagnosis not present

## 2021-09-12 DIAGNOSIS — W19XXXA Unspecified fall, initial encounter: Secondary | ICD-10-CM

## 2021-09-12 DIAGNOSIS — M549 Dorsalgia, unspecified: Secondary | ICD-10-CM | POA: Diagnosis not present

## 2021-09-12 DIAGNOSIS — M25551 Pain in right hip: Secondary | ICD-10-CM | POA: Diagnosis not present

## 2021-09-12 DIAGNOSIS — S299XXA Unspecified injury of thorax, initial encounter: Secondary | ICD-10-CM | POA: Diagnosis not present

## 2021-09-12 DIAGNOSIS — S0990XA Unspecified injury of head, initial encounter: Secondary | ICD-10-CM | POA: Diagnosis not present

## 2021-09-12 DIAGNOSIS — M79604 Pain in right leg: Secondary | ICD-10-CM | POA: Diagnosis not present

## 2021-09-12 DIAGNOSIS — R9431 Abnormal electrocardiogram [ECG] [EKG]: Secondary | ICD-10-CM | POA: Diagnosis not present

## 2021-09-12 DIAGNOSIS — W010XXA Fall on same level from slipping, tripping and stumbling without subsequent striking against object, initial encounter: Secondary | ICD-10-CM | POA: Insufficient documentation

## 2021-09-12 DIAGNOSIS — S2242XA Multiple fractures of ribs, left side, initial encounter for closed fracture: Secondary | ICD-10-CM | POA: Diagnosis not present

## 2021-09-12 DIAGNOSIS — Z599 Problem related to housing and economic circumstances, unspecified: Secondary | ICD-10-CM | POA: Diagnosis not present

## 2021-09-12 DIAGNOSIS — M545 Low back pain, unspecified: Secondary | ICD-10-CM | POA: Diagnosis not present

## 2021-09-12 DIAGNOSIS — S3992XA Unspecified injury of lower back, initial encounter: Secondary | ICD-10-CM | POA: Diagnosis not present

## 2021-09-12 DIAGNOSIS — M25561 Pain in right knee: Secondary | ICD-10-CM | POA: Diagnosis not present

## 2021-09-12 LAB — CBC
HCT: 42.3 % (ref 36.0–46.0)
Hemoglobin: 14 g/dL (ref 12.0–15.0)
MCH: 31 pg (ref 26.0–34.0)
MCHC: 33.1 g/dL (ref 30.0–36.0)
MCV: 93.6 fL (ref 80.0–100.0)
Platelets: 183 10*3/uL (ref 150–400)
RBC: 4.52 MIL/uL (ref 3.87–5.11)
RDW: 12.9 % (ref 11.5–15.5)
WBC: 9.8 10*3/uL (ref 4.0–10.5)
nRBC: 0 % (ref 0.0–0.2)

## 2021-09-12 LAB — COMPREHENSIVE METABOLIC PANEL
ALT: 11 U/L (ref 0–44)
AST: 19 U/L (ref 15–41)
Albumin: 4 g/dL (ref 3.5–5.0)
Alkaline Phosphatase: 51 U/L (ref 38–126)
Anion gap: 13 (ref 5–15)
BUN: 35 mg/dL — ABNORMAL HIGH (ref 8–23)
CO2: 25 mmol/L (ref 22–32)
Calcium: 9.6 mg/dL (ref 8.9–10.3)
Chloride: 101 mmol/L (ref 98–111)
Creatinine, Ser: 1.31 mg/dL — ABNORMAL HIGH (ref 0.44–1.00)
GFR, Estimated: 37 mL/min — ABNORMAL LOW (ref >60)
Glucose, Bld: 108 mg/dL — ABNORMAL HIGH (ref 70–99)
Potassium: 4.2 mmol/L (ref 3.5–5.1)
Sodium: 139 mmol/L (ref 135–145)
Total Bilirubin: 0.6 mg/dL (ref 0.3–1.2)
Total Protein: 7.4 g/dL (ref 6.5–8.1)

## 2021-09-12 MED ORDER — ACETAMINOPHEN 325 MG PO TABS
650.0000 mg | ORAL_TABLET | Freq: Once | ORAL | Status: AC
  Administered 2021-09-12: 650 mg via ORAL
  Filled 2021-09-12: qty 2

## 2021-09-12 MED ORDER — LIDOCAINE 4 % EX PTCH: 5.0000 | MEDICATED_PATCH | CUTANEOUS | 0 refills | Status: AC

## 2021-09-12 MED ORDER — METHOCARBAMOL 500 MG PO TABS: 500.0000 mg | ORAL_TABLET | Freq: Three times a day (TID) | ORAL | 0 refills | Status: AC | PRN

## 2021-09-12 MED ORDER — TRAMADOL HCL 50 MG PO TABS: 50.0000 mg | ORAL_TABLET | Freq: Two times a day (BID) | ORAL | 0 refills | Status: AC | PRN

## 2021-09-12 MED ORDER — LIDOCAINE 5 % EX PTCH
1.0000 | MEDICATED_PATCH | CUTANEOUS | Status: DC
  Administered 2021-09-12: 1 via TRANSDERMAL
  Filled 2021-09-12: qty 1

## 2021-09-12 NOTE — Discharge Instructions (Signed)
Continue Tylenol for pain. Additionally, there are medications prescribed to be taken AS NEEDED for pain control. Use these with, caution due to their sedating effects and increased risk of falls. Follow up with PCP when possible.

## 2021-09-12 NOTE — ED Provider Notes (Signed)
[facility] EMERGENCY DEPT Provider Note   CSN: [id number] Arrival date & time: 09/12/21  7829     History Chief Complaint  Patient presents with   [name] is a 87 y.o. female.  HPI Patient presents after a fall.  Fall occurred 2 days ago.  It was unwitnessed.  Caregiver suspects that patient went to use the bathroom and slipped on a bathroom mats.  She was found on the bathroom floor.  Since the fall, she has complained of back pain and right leg pain.  She has been ambulatory with a walker since the fall.  She was reportedly seen by her primary care doctor earlier today and advised to come into the ED.  She arrives with her husband and her caregiver.  Patient has dementia at baseline.  History is provided primarily by her caregiver.  She last received Tylenol 2 days ago.  She does not take any blood thinning medication.  Only daily medication is a thyroid pill.    Past Medical History:  Diagnosis Date   Dementia (HCC)    Thyroid disease     There are no problems to display for this patient.   Past Surgical History:  Procedure Laterality Date   APPENDECTOMY     BACK SURGERY     TONSILLECTOMY       OB History   No obstetric history on file.     Family History  Problem Relation Age of Onset   Cancer Mother     Social History   Tobacco Use   Smoking status: Never   Smokeless tobacco: Never  Vaping Use   Vaping Use: Never used  Substance Use Topics   Alcohol use: Yes    Comment: occ   Drug use: Never    Home Medications Prior to Admission medications   Medication Sig Start Date End Date Taking? Authorizing Provider  levothyroxine (SYNTHROID, LEVOTHROID) 75 MCG tablet Take 75 mcg by mouth daily before breakfast.   Yes [provider]  Lidocaine (HM LIDOCAINE PATCH) 4 % PTCH Apply 5 patches topically daily. Do not have more than one patch on at any given time. 09/12/21  Yes Gloris Manchester, MD  methocarbamol (ROBAXIN) 500  MG tablet Take 1 tablet (500 mg total) by mouth every 8 (eight) hours as needed for up to 5 days for muscle spasms. 09/12/21 09/17/21 Yes Gloris Manchester, MD  ondansetron (ZOFRAN ODT) 4 MG disintegrating tablet Take 1 tablet (4 mg total) by mouth every 8 (eight) hours as needed for nausea or vomiting. Patient not taking: Reported on 09/12/2021 04/05/15   Gerhard Munch, MD  traMADol (ULTRAM) 50 MG tablet Take 1 tablet (50 mg total) by mouth every 12 (twelve) hours as needed for up to 7 days. 09/12/21 09/19/21  Gloris Manchester, MD    Allergies    Penicillins  Review of Systems   Review of Systems  Unable to perform ROS: Dementia   Physical Exam Updated Vital Signs BP 124/82   Pulse 74   Temp 98.2 F (36.8 C) (Oral)   Resp 14   Ht  (1.295 m)   Wt 36.3 kg   SpO2 98%   BMI 21.62 kg/m   Physical Exam Vitals and nursing note reviewed.  Constitutional:      General: She is not in acute distress.    Appearance: Normal appearance. She is well-developed. She is not ill-appearing, toxic-appearing or diaphoretic.  HENT:     Head: Normocephalic  and atraumatic.     Right Ear: External ear normal.     Left Ear: External ear normal.     Nose: Nose normal.     Mouth/Throat:     Mouth: Mucous membranes are moist.     Pharynx: Oropharynx is clear.  Eyes:     Extraocular Movements: Extraocular movements intact.     Conjunctiva/sclera: Conjunctivae normal.  Cardiovascular:     Rate and Rhythm: Normal rate and regular rhythm.     Heart sounds: No murmur heard. Pulmonary:     Effort: Pulmonary effort is normal. No respiratory distress.     Breath sounds: Normal breath sounds. No wheezing or rales.  Abdominal:     Palpations: Abdomen is soft.     Tenderness: There is no abdominal tenderness.  Musculoskeletal:        General: Tenderness present. No swelling or deformity.     Cervical back: Neck supple. No tenderness.     Right lower leg: No edema.     Left lower leg: No edema.  Skin:     General: Skin is warm and dry.     Coloration: Skin is not jaundiced or pale.  Neurological:     General: No focal deficit present.     Mental Status: She is alert. Mental status is at baseline.  Psychiatric:        Mood and Affect: Mood normal.        Behavior: Behavior normal.    ED Results / Procedures / Treatments   Labs (all labs ordered are listed, but only abnormal results are displayed) Labs Reviewed  COMPREHENSIVE METABOLIC PANEL - Abnormal; Notable for the following components:      Result Value   Glucose, Bld 108 (*)    BUN 35 (*)    Creatinine, Ser 1.31 (*)    GFR, Estimated 37 (*)    All other components within normal limits  CBC    EKG None  Radiology DG Chest 1 View  Result Date: 09/12/2021 CLINICAL DATA:  Unwitnessed fall.  Back pain EXAM: CHEST  1 VIEW COMPARISON:  06/04/2011, 05/17/2011 FINDINGS: Heart size within normal limits. Aortic atherosclerosis. No focal airspace consolidation, pleural effusion, or pneumothorax. Numerous posterior left-sided rib fractures, new from 2012. Exaggerated thoracic kyphosis with multilevel prior cement augmentation. IMPRESSION: 1. No acute cardiopulmonary findings. 2. Numerous posterior left-sided rib fractures, new from 2012. Electronically Signed   By: Duanne Guess D.O.   On: 09/12/2021 11:31   DG Knee 2 Views Right  Result Date: 09/12/2021 CLINICAL DATA:  Right knee pain after fall. EXAM: RIGHT KNEE - 1-2 VIEW COMPARISON:  None. FINDINGS: No evidence of fracture, dislocation, or joint effusion. No evidence of arthropathy or other focal bone abnormality. Soft tissues are unremarkable. IMPRESSION: Negative. Electronically Signed   By: Lupita Raider M.D.   On: 09/12/2021 11:32   CT HEAD WO CONTRAST  Result Date: 09/12/2021 CLINICAL DATA:  Unwitnessed fall on 09/10/2021 EXAM: CT HEAD WITHOUT CONTRAST TECHNIQUE: Contiguous axial images were obtained from the base of the skull through the vertex without intravenous  contrast. COMPARISON:  CT head 10/17/2020 FINDINGS: Brain: There is no evidence of acute intracranial hemorrhage, extra-axial fluid collection, or acute infarct. There is moderate global parenchymal volume loss with commensurate enlargement of the ventricular system. Confluent hypodensity throughout the subcortical and periventricular white matter likely reflects sequela of advanced chronic white matter microangiopathy. There is no mass lesion.  There is no midline shift. Vascular: No hyperdense  vessel or unexpected calcification. Skull: Normal. Negative for fracture or focal lesion. Sinuses/Orbits: The paranasal sinuses are clear. Bilateral lens implants are in place. The globes and orbits are otherwise unremarkable. Other: None. IMPRESSION: 1. No acute intracranial hemorrhage or calvarial fracture. 2. Global parenchymal volume loss and chronic white matter microangiopathy. Electronically Signed   By: Lesia Hausen M.D.   On: 09/12/2021 11:48   CT CERVICAL SPINE WO CONTRAST  Result Date: 09/12/2021 CLINICAL DATA:  Fall, trauma EXAM: CT CERVICAL SPINE WITHOUT CONTRAST TECHNIQUE: Multidetector CT imaging of the cervical spine was performed without intravenous contrast. Multiplanar CT image reconstructions were also generated. COMPARISON:  Cervical spine CT 10/17/2020 FINDINGS: Alignment: There is exaggerated lordosis in the cervical spine. There is no jumped or perched facets or other evidence of traumatic malalignment. Skull base and vertebrae: Skull base alignment is maintained. Severe compression deformity of the T2 vertebral body with mild bony retropulsion is unchanged since the study from 10/17/2020. Severe compression deformity of the T5 vertebral body with mild bony retropulsion appears remote but is new/significantly worsened since 10/17/2020. Post vertebral augmentation changes are partially imaged at T6. Cervical vertebral body heights are preserved. There is no evidence of acute fracture. Soft tissues  and spinal canal: No prevertebral fluid or swelling. No visible canal hematoma. Disc levels: There is mild degenerative change at the craniocervical junction. There is mild multilevel degenerative endplate change and facet arthropathy throughout the cervical spine. There is no high-grade spinal canal or neural foraminal stenosis. Upper chest: There is prominent scarring in the lung apices, grossly similar to the prior study. Other: Noted. IMPRESSION: 1. No evidence of acute fracture or traumatic malalignment in the cervical spine. 2. Severe compression deformity of the T5 vertebral body does not appear acute, but is new/significantly worsened compared to the cervical spine CT from 10/17/2020. 3. Unchanged severe compression deformity of the T2 vertebral body compared to the study. Electronically Signed   By: Lesia Hausen M.D.   On: 09/12/2021 11:55   CT Thoracic Spine Wo Contrast  Result Date: 09/12/2021 CLINICAL DATA:  Polytrauma.  Unwitnessed fall on Sunday. EXAM: CT THORACIC AND LUMBAR SPINE WITHOUT CONTRAST TECHNIQUE: Multidetector CT imaging of the thoracic and lumbar spine was performed without contrast. Multiplanar CT image reconstructions were also generated. COMPARISON:  None. FINDINGS: CT THORACIC SPINE FINDINGS Alignment: Severe thoracic kyphosis. The overall alignment of the thoracic vertebral bodies is maintained. Vertebrae: There are severe/significant compression fractures of T2, T5, T7 and L1. These appear to be remote. There are vertebral augmentation changes at T6, T11 and T12. Numerous remote healed rib fractures are noted. There is also an acute appearing it of the right ninth posterior rib. Paraspinal and other soft tissues: No significant paraspinal findings. No acute paraspinal hematoma to suggest acute compression fractures. Chronic appearing lung changes with emphysema and pulmonary scarring. Extensive biapical pleural and parenchymal scarring with calcifications. No worrisome pulmonary  lesions. Age related advanced vascular calcifications. Disc levels: No large disc protrusions. Mild multilevel retropulsion at the fracture levels but no significant canal compromise. CT LUMBAR SPINE FINDINGS Segmentation: There are five lumbar type vertebral bodies. The last full intervertebral disc space is labeled L5-S1. Alignment: Normal Vertebrae: Advanced osteoporosis. Severe compression fracture of L1 with vertebral plana appearance and mild retropulsion. This has remote imaging appearance without paraspinal hematoma to suggest an acute fracture. Suspect acute fracture of the L4 vertebral body involving the superior endplate without significant compression deformity. There is mild retropulsion but no significant canal compromise. Paraspinal  and other soft tissues: No significant findings. Advanced aortic calcifications are noted but no aneurysm. Disc levels: Disc spaces are fairly well preserved. No large disc protrusions are identified. There is mild retropulsion involving the posterosuperior aspect of L4 with mild mass effect on the thecal sac and possibly a small amount of epidural hematoma. IMPRESSION: 1. Severe/significant compression fractures of T2, T5, T7 and L1. These appear to be remote. There are vertebral augmentation changes at T6, T11 and T12. 2. Acute appearing fracture of the right ninth posterior rib. 3. Severe osteoporosis. 4. Suspect acute fracture of the L4 vertebral body involving the superior endplate without significant compression deformity. 5. Mild retropulsion involving the posterosuperior aspect of L4 with mild mass effect on the thecal sac and possibly a small amount of epidural hematoma. Aortic Atherosclerosis (ICD10-I70.0). Electronically Signed   By: Rudie Meyer M.D.   On: 09/12/2021 11:45   CT Lumbar Spine Wo Contrast  Result Date: 09/12/2021 CLINICAL DATA:  Polytrauma.  Unwitnessed fall on Sunday. EXAM: CT THORACIC AND LUMBAR SPINE WITHOUT CONTRAST TECHNIQUE:  Multidetector CT imaging of the thoracic and lumbar spine was performed without contrast. Multiplanar CT image reconstructions were also generated. COMPARISON:  None. FINDINGS: CT THORACIC SPINE FINDINGS Alignment: Severe thoracic kyphosis. The overall alignment of the thoracic vertebral bodies is maintained. Vertebrae: There are severe/significant compression fractures of T2, T5, T7 and L1. These appear to be remote. There are vertebral augmentation changes at T6, T11 and T12. Numerous remote healed rib fractures are noted. There is also an acute appearing it of the right ninth posterior rib. Paraspinal and other soft tissues: No significant paraspinal findings. No acute paraspinal hematoma to suggest acute compression fractures. Chronic appearing lung changes with emphysema and pulmonary scarring. Extensive biapical pleural and parenchymal scarring with calcifications. No worrisome pulmonary lesions. Age related advanced vascular calcifications. Disc levels: No large disc protrusions. Mild multilevel retropulsion at the fracture levels but no significant canal compromise. CT LUMBAR SPINE FINDINGS Segmentation: There are five lumbar type vertebral bodies. The last full intervertebral disc space is labeled L5-S1. Alignment: Normal Vertebrae: Advanced osteoporosis. Severe compression fracture of L1 with vertebral plana appearance and mild retropulsion. This has remote imaging appearance without paraspinal hematoma to suggest an acute fracture. Suspect acute fracture of the L4 vertebral body involving the superior endplate without significant compression deformity. There is mild retropulsion but no significant canal compromise. Paraspinal and other soft tissues: No significant findings. Advanced aortic calcifications are noted but no aneurysm. Disc levels: Disc spaces are fairly well preserved. No large disc protrusions are identified. There is mild retropulsion involving the posterosuperior aspect of L4 with mild  mass effect on the thecal sac and possibly a small amount of epidural hematoma. IMPRESSION: 1. Severe/significant compression fractures of T2, T5, T7 and L1. These appear to be remote. There are vertebral augmentation changes at T6, T11 and T12. 2. Acute appearing fracture of the right ninth posterior rib. 3. Severe osteoporosis. 4. Suspect acute fracture of the L4 vertebral body involving the superior endplate without significant compression deformity. 5. Mild retropulsion involving the posterosuperior aspect of L4 with mild mass effect on the thecal sac and possibly a small amount of epidural hematoma. Aortic Atherosclerosis (ICD10-I70.0). Electronically Signed   By: Rudie Meyer M.D.   On: 09/12/2021 11:45   DG Hip Unilat W or Wo Pelvis 2-3 Views Right  Result Date: 09/12/2021 CLINICAL DATA:  Right hip pain after fall. EXAM: DG HIP (WITH OR WITHOUT PELVIS) 2-3V RIGHT COMPARISON:  Apr 01, 2019. FINDINGS: There is no evidence of hip fracture or dislocation. Mild narrowing of right hip joint is noted. IMPRESSION: Mild degenerative joint disease of right hip. No acute abnormality seen. Electronically Signed   By: Lupita Raider M.D.   On: 09/12/2021 11:29    Procedures Procedures   Medications Ordered in ED Medications  lidocaine (LIDODERM) 5 % 1 patch (1 patch Transdermal Patch Applied 09/12/21 1509)  acetaminophen (TYLENOL) tablet 650 mg (650 mg Oral Given 09/12/21 1158)    ED Course  I have reviewed the triage vital signs and the nursing notes.  Pertinent labs & imaging results that were available during my care of the patient were reviewed by me and considered in my medical decision making (see chart for details).    MDM Rules/Calculators/A&P                          Patient presents for persistent back and right leg pain after a fall 2 days ago.  Pain is worsened with movements.  She has been ambulatory with a walker since the fall.  She lives at home with her husband.  She has  caregiver support up until 6 PM every day.  She does arrive to the ED with her caregiver, who provides most of the history.  No obvious deformities are appreciated on exam.  Per caregiver, patient is currently at her mental baseline.  She is not on any blood thinners.  She does have tenderness throughout her back.  No pain is elicited with active or passive range of motion of her lower extremities.  She does have good strength in her bilateral lower extremities.  She denies any areas of numbness.  Imaging studies were ordered.  Patient was given Tylenol for analgesia.  Imaging studies showed numerous old fractures and spine and left-sided ribs.  It does appear that there is a new fracture in the posterior ninth rib on the right side.  Additionally, radiology interpretation of CT scans is that there is a possible new fracture at L4 with some retropulsion and possible small epidural hematoma.  I did speak with the neurosurgeon on-call regarding these imaging findings.  I also informed patient's husband and caregiver of results.  Patient's husband stated that she would not want surgery.  Neurosurgeon on-call stated that no further imaging or treatment beyond pain control is indicated, based on CT results.  I also spoke with the patient's daughter over telephone.  Patient's daughter is working on getting 24-hour care.  Today's caregiver will go home at 6 PM.  Patient's daughter does feel that she can get a caregiver to come by early in the morning.  Patient's husband and daughter's main concern is that her pain will be adequately controlled.  On reassessment, patient resting in supine position.  While in supine position, patient's pain is minimal.  Patient's caregiver and husband were instructed to continue Tylenol for pain.  Additionally, prescriptions were given for tramadol, Robaxin, and lidocaine patches, to be used as needed.  Patient was discharged in stable condition.  Final Clinical Impression(s) / ED  Diagnoses Final diagnoses:  Fall, initial encounter    Rx / DC Orders ED Discharge Orders          Ordered    traMADol (ULTRAM) 50 MG tablet  Every 12 hours PRN        09/12/21 1512    methocarbamol (ROBAXIN) 500 MG tablet  Every 8 hours PRN  09/12/21 1512    Lidocaine (HM LIDOCAINE PATCH) 4 % PTCH  Every 24 hours        09/12/21 1512             Gloris Manchester, MD 09/12/21 1845

## 2021-09-12 NOTE — ED Notes (Signed)
S1 s2 heard cap refill good

## 2021-09-12 NOTE — ED Triage Notes (Signed)
Pt from home. Had an unwitnessed fall on Sunday the October 23rd. Pt possibly slipped off the toilet onto her back and has been complaining of back pain. Pt is not on thinners. Pt denis N/V. Pt denis blurry vision. Pt is at baseline and has dementia for past 2 years

## 2021-09-12 NOTE — ED Notes (Signed)
Patient transported to CT 

## 2022-07-05 IMAGING — CT CT CERVICAL SPINE W/O CM
3 of 4 series · 13 of 33 positions shown, 16 images · non-contrast
Comparison: Cervical spine CT 10/17/2020

CLINICAL DATA: Fall, trauma

EXAM:
CT CERVICAL SPINE WITHOUT CONTRAST
TECHNIQUE: Multidetector CT imaging of the cervical spine was performed without
intravenous contrast. Multiplanar CT image reconstructions were also
generated.

[Series 4: c spine soft · axial · 0.32mm/px · z∈[-195,-113]mm · 5 of 61 slices shown, 7 images]
[im 10/61  soft-tissue]
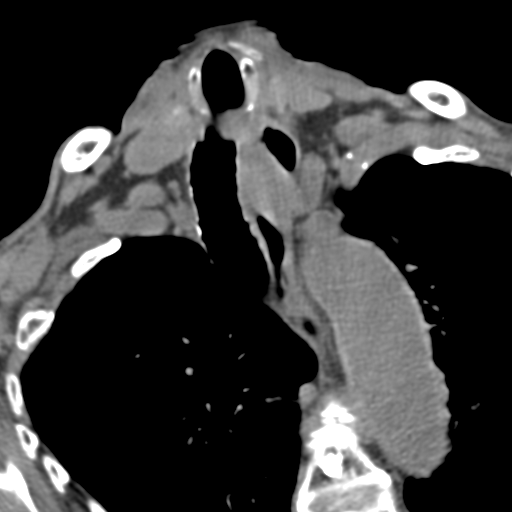
[im 10/61  bone]
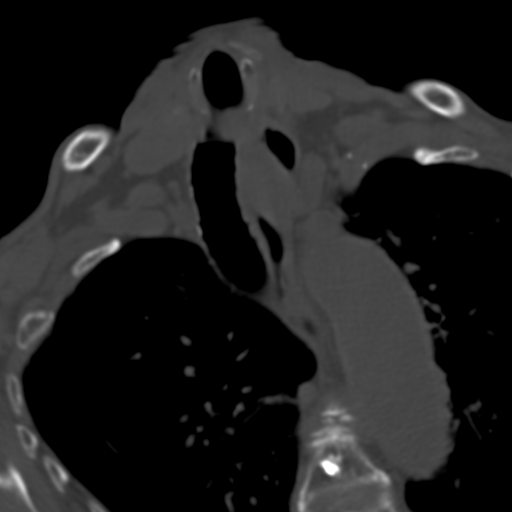
[im 19/61  bone]
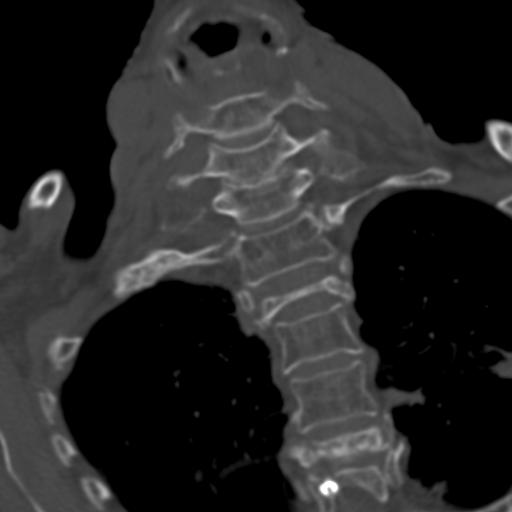
[im 33/61  bone]
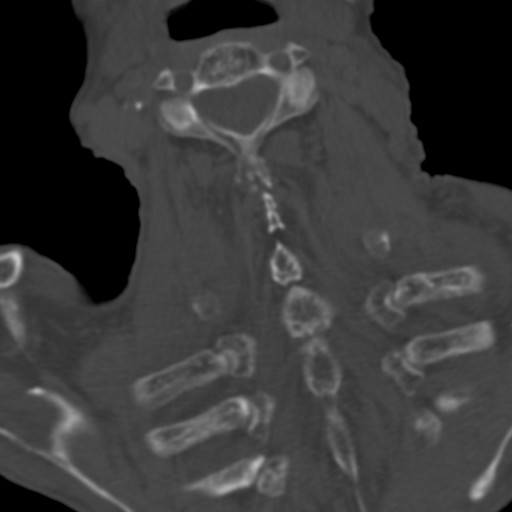
[im 42/61  bone]
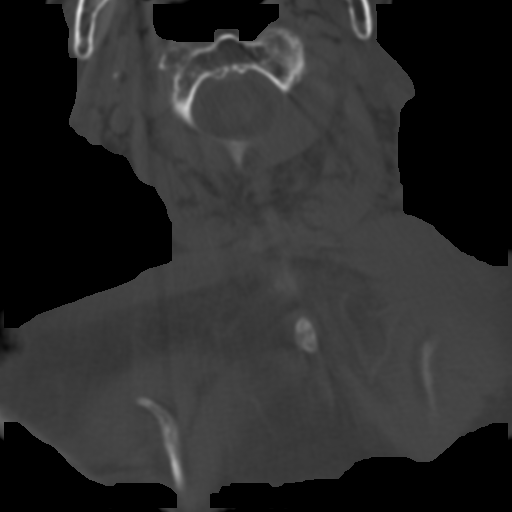
[im 51/61  soft-tissue]
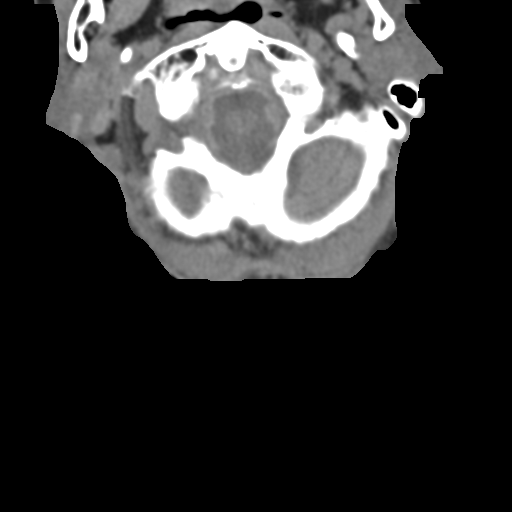
[im 51/61  bone]
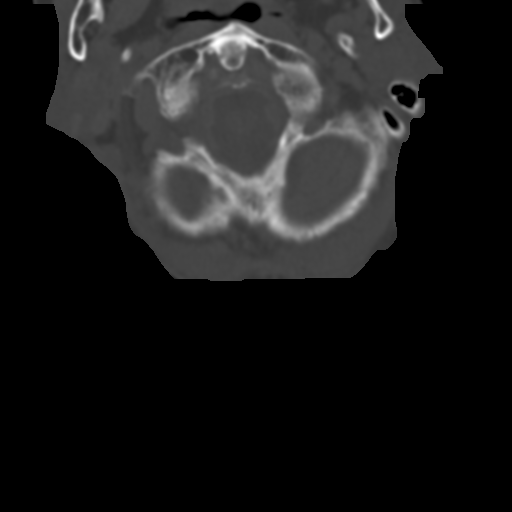

[Series 5: cor bone · coronal · 0.25mm/px · 3 of 92 slices shown]
[im 23/92  bone]
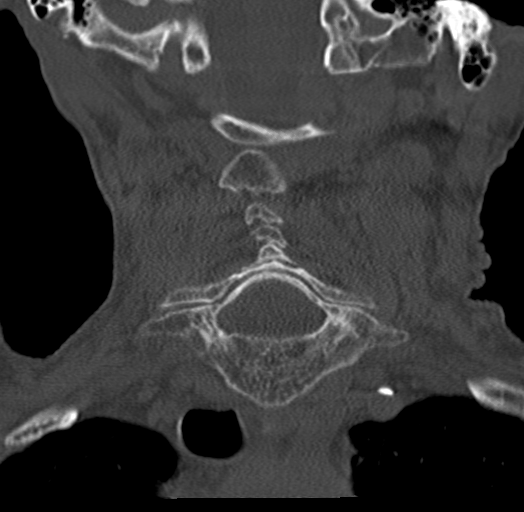
[im 46/92  bone]
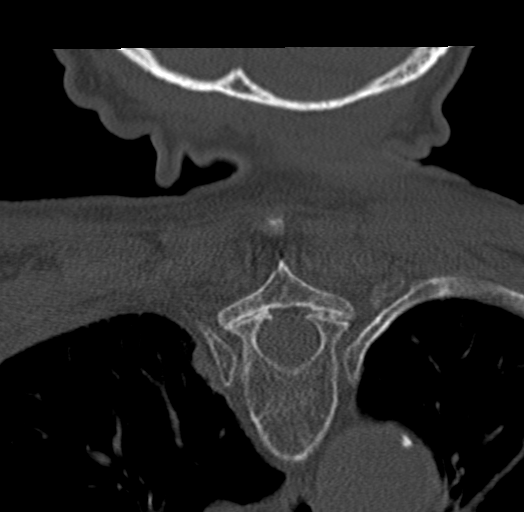
[im 69/92  bone]
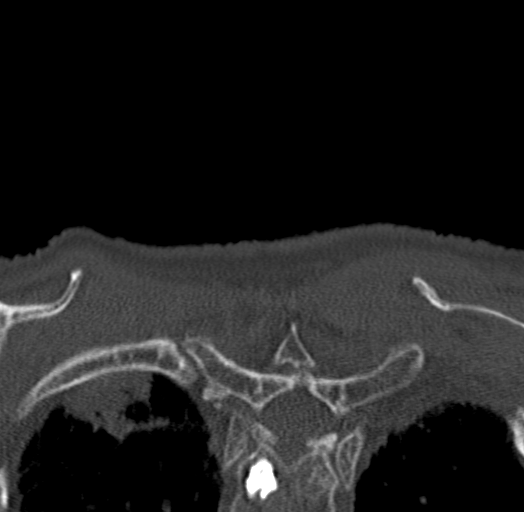

[Series 6: sag bone · sagittal · 0.25mm/px · 5 of 50 slices shown, 6 images]
[im 17/50  bone]
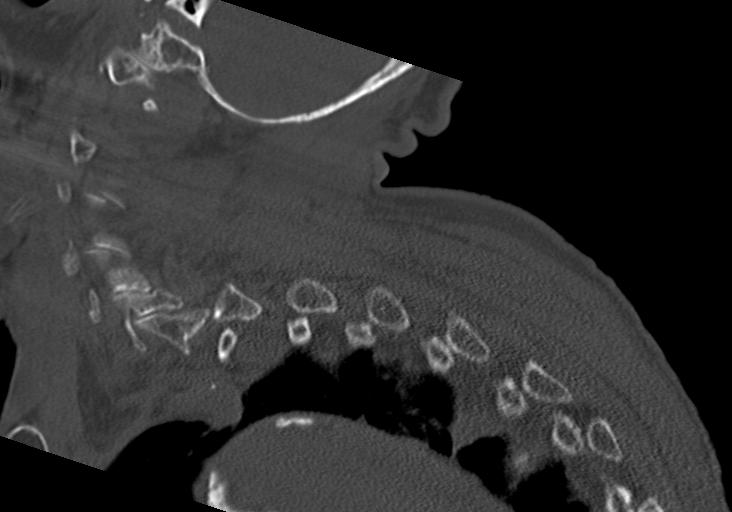
[im 21/50  bone]
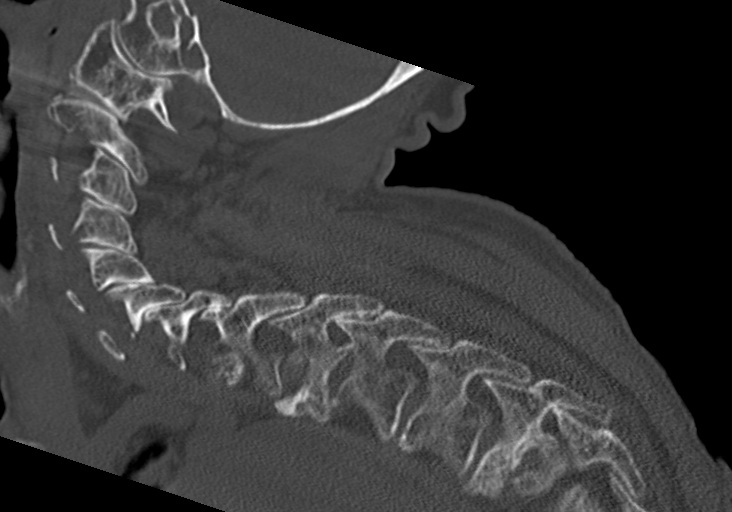
[im 25/50  soft-tissue]
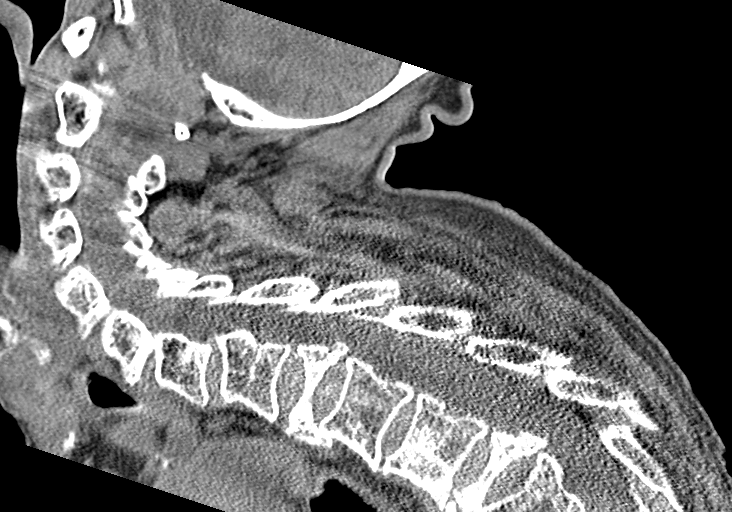
[im 25/50  bone]
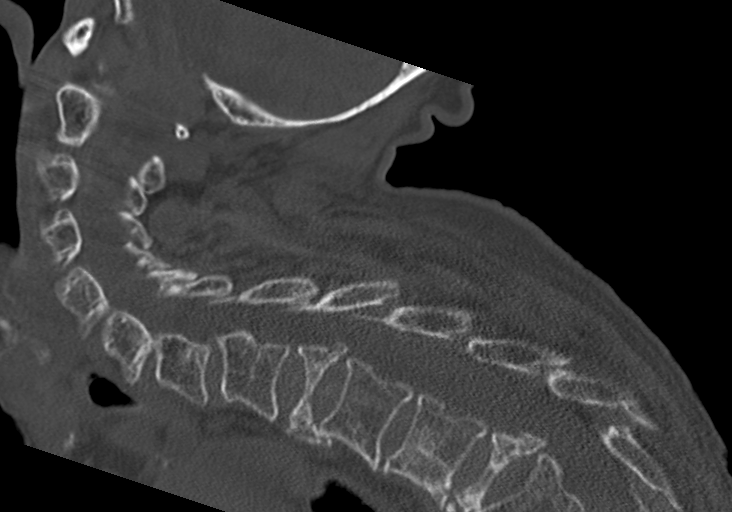
[im 29/50  bone]
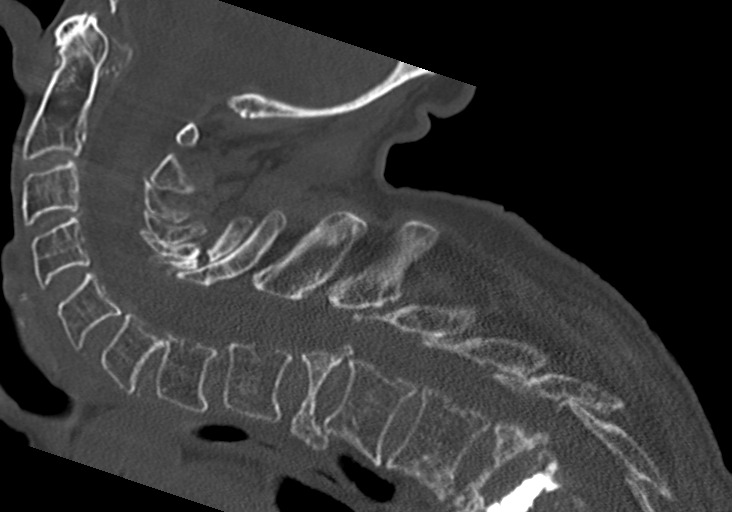
[im 33/50  bone]
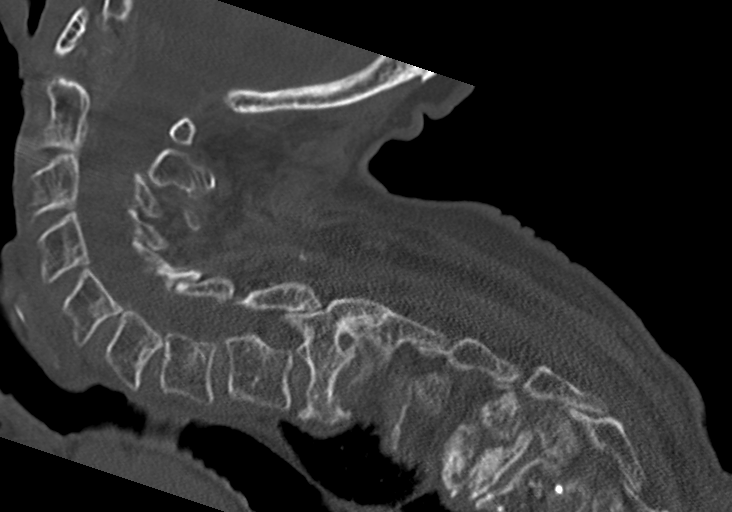

[13 of 33 positions shown; findings below may reference images not displayed]

FINDINGS: Alignment: There is exaggerated lordosis in the cervical spine.
There is no jumped or perched facets or other evidence of traumatic
malalignment.

Skull base and vertebrae: Skull base alignment is maintained.

Severe compression deformity of the T2 vertebral body with mild bony
retropulsion is unchanged since the study from 10/17/2020. Severe
compression deformity of the T5 vertebral body with mild bony
retropulsion appears remote but is new/significantly worsened since
10/17/2020. Post vertebral augmentation changes are partially imaged
at T6.

Cervical vertebral body heights are preserved. There is no evidence
of acute fracture.

Soft tissues and spinal canal: No prevertebral fluid or swelling. No
visible canal hematoma.

Disc levels: There is mild degenerative change at the craniocervical
junction. There is mild multilevel degenerative endplate change and
facet arthropathy throughout the cervical spine. There is no
high-grade spinal canal or neural foraminal stenosis.

Upper chest: There is prominent scarring in the lung apices, grossly
similar to the prior study.

Other: Noted.
IMPRESSION: 1. No evidence of acute fracture or traumatic malalignment in the
cervical spine.
2. Severe compression deformity of the T5 vertebral body does not
appear acute, but is new/significantly worsened compared to the
cervical spine CT from 10/17/2020.
[DATE]. Unchanged severe compression deformity of the T2 vertebral body
compared to the study.

## 2022-11-19 DEATH — deceased
